# Patient Record
Sex: Male | Born: 1955
Health system: Southern US, Community
[De-identification: ages and names within clinical notes are randomized; demographics above are authoritative.]

## PROBLEM LIST (undated history)

## (undated) DIAGNOSIS — I10 Essential (primary) hypertension: Secondary | ICD-10-CM

## (undated) DIAGNOSIS — K5900 Constipation, unspecified: Secondary | ICD-10-CM

## (undated) DIAGNOSIS — C801 Malignant (primary) neoplasm, unspecified: Secondary | ICD-10-CM

## (undated) HISTORY — PX: PROSTATECTOMY: SHX69

## (undated) HISTORY — DX: Constipation, unspecified: K59.00

---

## 2015-08-17 DIAGNOSIS — I10 Essential (primary) hypertension: Secondary | ICD-10-CM | POA: Diagnosis not present

## 2015-08-17 DIAGNOSIS — E875 Hyperkalemia: Secondary | ICD-10-CM | POA: Diagnosis not present

## 2015-08-17 DIAGNOSIS — Z8546 Personal history of malignant neoplasm of prostate: Secondary | ICD-10-CM | POA: Diagnosis not present

## 2015-08-17 DIAGNOSIS — Z9221 Personal history of antineoplastic chemotherapy: Secondary | ICD-10-CM | POA: Diagnosis not present

## 2015-08-17 DIAGNOSIS — Z08 Encounter for follow-up examination after completed treatment for malignant neoplasm: Secondary | ICD-10-CM | POA: Diagnosis not present

## 2015-08-17 DIAGNOSIS — C61 Malignant neoplasm of prostate: Secondary | ICD-10-CM | POA: Diagnosis not present

## 2015-08-17 DIAGNOSIS — Z9079 Acquired absence of other genital organ(s): Secondary | ICD-10-CM | POA: Diagnosis not present

## 2016-02-22 DIAGNOSIS — Z8546 Personal history of malignant neoplasm of prostate: Secondary | ICD-10-CM | POA: Diagnosis not present

## 2016-02-22 DIAGNOSIS — C61 Malignant neoplasm of prostate: Secondary | ICD-10-CM | POA: Diagnosis not present

## 2016-02-22 DIAGNOSIS — Z08 Encounter for follow-up examination after completed treatment for malignant neoplasm: Secondary | ICD-10-CM | POA: Diagnosis not present

## 2016-02-22 DIAGNOSIS — I10 Essential (primary) hypertension: Secondary | ICD-10-CM | POA: Diagnosis not present

## 2016-02-22 DIAGNOSIS — Z9221 Personal history of antineoplastic chemotherapy: Secondary | ICD-10-CM | POA: Diagnosis not present

## 2016-02-22 DIAGNOSIS — Z9079 Acquired absence of other genital organ(s): Secondary | ICD-10-CM | POA: Diagnosis not present

## 2016-03-21 DIAGNOSIS — R030 Elevated blood-pressure reading, without diagnosis of hypertension: Secondary | ICD-10-CM | POA: Diagnosis not present

## 2016-06-09 DIAGNOSIS — R030 Elevated blood-pressure reading, without diagnosis of hypertension: Secondary | ICD-10-CM | POA: Diagnosis not present

## 2016-08-22 DIAGNOSIS — I10 Essential (primary) hypertension: Secondary | ICD-10-CM | POA: Diagnosis not present

## 2016-08-22 DIAGNOSIS — Z9079 Acquired absence of other genital organ(s): Secondary | ICD-10-CM | POA: Diagnosis not present

## 2016-08-22 DIAGNOSIS — C61 Malignant neoplasm of prostate: Secondary | ICD-10-CM | POA: Diagnosis not present

## 2016-08-22 DIAGNOSIS — Z9221 Personal history of antineoplastic chemotherapy: Secondary | ICD-10-CM | POA: Diagnosis not present

## 2017-02-23 DIAGNOSIS — C61 Malignant neoplasm of prostate: Secondary | ICD-10-CM | POA: Diagnosis not present

## 2017-02-23 DIAGNOSIS — Z9079 Acquired absence of other genital organ(s): Secondary | ICD-10-CM | POA: Diagnosis not present

## 2017-02-23 DIAGNOSIS — Z8546 Personal history of malignant neoplasm of prostate: Secondary | ICD-10-CM | POA: Diagnosis not present

## 2017-02-23 DIAGNOSIS — Z9221 Personal history of antineoplastic chemotherapy: Secondary | ICD-10-CM | POA: Diagnosis not present

## 2017-02-23 DIAGNOSIS — R739 Hyperglycemia, unspecified: Secondary | ICD-10-CM | POA: Diagnosis not present

## 2017-02-23 DIAGNOSIS — I10 Essential (primary) hypertension: Secondary | ICD-10-CM | POA: Diagnosis not present

## 2017-02-23 DIAGNOSIS — Z08 Encounter for follow-up examination after completed treatment for malignant neoplasm: Secondary | ICD-10-CM | POA: Diagnosis not present

## 2017-08-28 DIAGNOSIS — Z923 Personal history of irradiation: Secondary | ICD-10-CM | POA: Diagnosis not present

## 2017-08-28 DIAGNOSIS — Z08 Encounter for follow-up examination after completed treatment for malignant neoplasm: Secondary | ICD-10-CM | POA: Diagnosis not present

## 2017-08-28 DIAGNOSIS — Z8546 Personal history of malignant neoplasm of prostate: Secondary | ICD-10-CM | POA: Diagnosis not present

## 2017-08-28 DIAGNOSIS — Z9079 Acquired absence of other genital organ(s): Secondary | ICD-10-CM | POA: Diagnosis not present

## 2017-08-28 DIAGNOSIS — C61 Malignant neoplasm of prostate: Secondary | ICD-10-CM | POA: Diagnosis not present

## 2017-08-28 DIAGNOSIS — R739 Hyperglycemia, unspecified: Secondary | ICD-10-CM | POA: Diagnosis not present

## 2017-08-28 DIAGNOSIS — I10 Essential (primary) hypertension: Secondary | ICD-10-CM | POA: Diagnosis not present

## 2017-09-25 DIAGNOSIS — R030 Elevated blood-pressure reading, without diagnosis of hypertension: Secondary | ICD-10-CM | POA: Diagnosis not present

## 2017-09-25 DIAGNOSIS — Z6825 Body mass index (BMI) 25.0-25.9, adult: Secondary | ICD-10-CM | POA: Diagnosis not present

## 2018-03-05 DIAGNOSIS — C61 Malignant neoplasm of prostate: Secondary | ICD-10-CM | POA: Diagnosis not present

## 2018-03-05 DIAGNOSIS — Z192 Hormone resistant malignancy status: Secondary | ICD-10-CM | POA: Diagnosis not present

## 2018-03-05 DIAGNOSIS — R739 Hyperglycemia, unspecified: Secondary | ICD-10-CM | POA: Diagnosis not present

## 2018-03-05 DIAGNOSIS — I10 Essential (primary) hypertension: Secondary | ICD-10-CM | POA: Diagnosis not present

## 2018-05-07 DIAGNOSIS — R454 Irritability and anger: Secondary | ICD-10-CM | POA: Diagnosis not present

## 2018-06-11 DIAGNOSIS — Z6826 Body mass index (BMI) 26.0-26.9, adult: Secondary | ICD-10-CM | POA: Diagnosis not present

## 2018-06-11 DIAGNOSIS — I10 Essential (primary) hypertension: Secondary | ICD-10-CM | POA: Diagnosis not present

## 2018-06-11 DIAGNOSIS — R454 Irritability and anger: Secondary | ICD-10-CM | POA: Diagnosis not present

## 2018-07-30 DIAGNOSIS — R454 Irritability and anger: Secondary | ICD-10-CM | POA: Diagnosis not present

## 2018-07-30 DIAGNOSIS — I10 Essential (primary) hypertension: Secondary | ICD-10-CM | POA: Diagnosis not present

## 2018-09-03 DIAGNOSIS — C61 Malignant neoplasm of prostate: Secondary | ICD-10-CM | POA: Diagnosis not present

## 2018-09-03 DIAGNOSIS — Z192 Hormone resistant malignancy status: Secondary | ICD-10-CM | POA: Diagnosis not present

## 2019-03-04 DIAGNOSIS — I10 Essential (primary) hypertension: Secondary | ICD-10-CM | POA: Diagnosis not present

## 2019-03-04 DIAGNOSIS — Z9079 Acquired absence of other genital organ(s): Secondary | ICD-10-CM | POA: Diagnosis not present

## 2019-03-04 DIAGNOSIS — C61 Malignant neoplasm of prostate: Secondary | ICD-10-CM | POA: Diagnosis not present

## 2019-03-04 DIAGNOSIS — R739 Hyperglycemia, unspecified: Secondary | ICD-10-CM | POA: Diagnosis not present

## 2019-03-04 DIAGNOSIS — Z8546 Personal history of malignant neoplasm of prostate: Secondary | ICD-10-CM | POA: Diagnosis not present

## 2019-05-06 DIAGNOSIS — M25511 Pain in right shoulder: Secondary | ICD-10-CM | POA: Diagnosis not present

## 2019-05-06 DIAGNOSIS — Z8546 Personal history of malignant neoplasm of prostate: Secondary | ICD-10-CM | POA: Diagnosis not present

## 2019-09-02 DIAGNOSIS — Z192 Hormone resistant malignancy status: Secondary | ICD-10-CM | POA: Diagnosis not present

## 2019-09-02 DIAGNOSIS — C61 Malignant neoplasm of prostate: Secondary | ICD-10-CM | POA: Diagnosis not present

## 2019-09-02 DIAGNOSIS — I10 Essential (primary) hypertension: Secondary | ICD-10-CM | POA: Diagnosis not present

## 2019-09-12 ENCOUNTER — Encounter (HOSPITAL_COMMUNITY): Payer: Self-pay | Admitting: Emergency Medicine

## 2019-09-12 ENCOUNTER — Emergency Department (HOSPITAL_COMMUNITY): Payer: BC Managed Care – PPO

## 2019-09-12 ENCOUNTER — Emergency Department (HOSPITAL_COMMUNITY)
Admission: EM | Admit: 2019-09-12 | Discharge: 2019-09-12 | Disposition: A | Discharge status: Home / Self Care | Payer: BC Managed Care – PPO | Attending: Emergency Medicine | Admitting: Emergency Medicine

## 2019-09-12 ENCOUNTER — Other Ambulatory Visit: Payer: Self-pay

## 2019-09-12 DIAGNOSIS — Z79899 Other long term (current) drug therapy: Secondary | ICD-10-CM | POA: Insufficient documentation

## 2019-09-12 DIAGNOSIS — R55 Syncope and collapse: Secondary | ICD-10-CM

## 2019-09-12 DIAGNOSIS — K5900 Constipation, unspecified: Secondary | ICD-10-CM | POA: Diagnosis not present

## 2019-09-12 DIAGNOSIS — I10 Essential (primary) hypertension: Secondary | ICD-10-CM | POA: Insufficient documentation

## 2019-09-12 HISTORY — DX: Essential (primary) hypertension: I10

## 2019-09-12 HISTORY — DX: Malignant (primary) neoplasm, unspecified: C80.1

## 2019-09-12 LAB — COMPREHENSIVE METABOLIC PANEL
ALT: 15 U/L (ref 0–44)
AST: 21 U/L (ref 15–41)
Albumin: 4 g/dL (ref 3.5–5.0)
Alkaline Phosphatase: 52 U/L (ref 38–126)
Anion gap: 11 (ref 5–15)
BUN: 19 mg/dL (ref 8–23)
CO2: 25 mmol/L (ref 22–32)
Calcium: 8.7 mg/dL — ABNORMAL LOW (ref 8.9–10.3)
Chloride: 100 mmol/L (ref 98–111)
Creatinine, Ser: 1.05 mg/dL (ref 0.61–1.24)
GFR calc Af Amer: >60 mL/min (ref >60)
GFR calc non Af Amer: >60 mL/min (ref >60)
Glucose, Bld: 146 mg/dL — ABNORMAL HIGH (ref 70–99)
Potassium: 3.5 mmol/L (ref 3.5–5.1)
Sodium: 136 mmol/L (ref 135–145)
Total Bilirubin: 0.4 mg/dL (ref 0.3–1.2)
Total Protein: 7.4 g/dL (ref 6.5–8.1)

## 2019-09-12 LAB — CBC
HCT: 45.1 % (ref 39.0–52.0)
Hemoglobin: 15.1 g/dL (ref 13.0–17.0)
MCH: 30.9 pg (ref 26.0–34.0)
MCHC: 33.5 g/dL (ref 30.0–36.0)
MCV: 92.4 fL (ref 80.0–100.0)
Platelets: 177 10*3/uL (ref 150–400)
RBC: 4.88 MIL/uL (ref 4.22–5.81)
RDW: 12 % (ref 11.5–15.5)
WBC: 8.2 10*3/uL (ref 4.0–10.5)
nRBC: 0 % (ref 0.0–0.2)

## 2019-09-12 MED ORDER — SODIUM CHLORIDE 0.9 % IV BOLUS
1000.0000 mL | Freq: Once | INTRAVENOUS | Status: AC
  Administered 2019-09-12: 1000 mL via INTRAVENOUS

## 2019-09-12 MED ORDER — ONDANSETRON HCL 4 MG/2ML IJ SOLN
4.0000 mg | Freq: Once | INTRAMUSCULAR | Status: AC
  Administered 2019-09-12: 4 mg via INTRAVENOUS
  Filled 2019-09-12: qty 2

## 2019-09-12 NOTE — ED Notes (Signed)
Pt is aware we need urine sample, urinal at bedside.  

## 2019-09-12 NOTE — ED Triage Notes (Signed)
PT brought in by family today from home. PT now alert and oriented and vomiting with diaphoresis on arrival. PT states he when he woke up this morning he felt lightheaded and was walking from the bedroom to the living room and passed out and fell in the floor. PT states he has had constipation for the past few days and running a fever at home x2 days. PT denies any pain at this time.

## 2019-09-12 NOTE — ED Provider Notes (Signed)
[facility] EMERGENCY DEPARTMENT Provider Note   CSN: [id number] Arrival date & time: 09/12/19  [id number]     History Chief Complaint  Patient presents with  . Loss of Consciousness    Zachary Erickson is a 64 y.o. male.  HPI   This patient is a 64 year old male, reports a history of having hypertension, he does not take any other daily medications other than one blood pressure pill and he cannot remember the name of it.  He presents to the hospital after having a loss of consciousness this morning.  The patient reportedly has had some difficulty with bowel movements over the last month having 1 bowel movement every 3 days, feels very concerned about this and has developed a subjective fever for the last 2 days.  It has not been measured.  He denies any urinary symptoms, hematuria, dysuria, rashes, swelling, has no abdominal pain, no back pain, no chest pain coughing or shortness of breath.  He has been otherwise well and yesterday had normal food.  This morning he got up to use the bathroom and after he urinated upon walking back out of the bathroom he developed acute onset of lightheadedness, dizziness, had a syncopal event which was witnessed and collapsed to the ground.  He had episodes of nausea vomiting and diaphoresis and family brought him to the hospital immediately.  At the time of arrival the patient was pale and diaphoretic but gradually improving and now is back to baseline within 10 minutes of arrival.  He states that this time he just feels nauseated.  He denies any abdominal pain whatsoever at this time  Past Medical History:  Diagnosis Date  . Cancer [facility])    prostate cancer (2013)  . Hypertension     There are no problems to display for this patient.   History reviewed. No pertinent surgical history.     History reviewed. No pertinent family history.  Social History   Tobacco Use  . Smoking status: Never Smoker  . Smokeless tobacco: Never Used  Substance Use  Topics  . Alcohol use: Never  . Drug use: Never    Home Medications Prior to Admission medications   Medication Sig Start Date End Date Taking? Authorizing Provider  lisinopril (ZESTRIL) 10 MG tablet Take 10 mg by mouth daily. 08/12/19  Yes [provider]  Multiple Vitamin (MULTI-VITAMIN) tablet Take by mouth.   Yes [provider]    Allergies    Patient has no known allergies.  Review of Systems   Review of Systems  All other systems reviewed and are negative.   Physical Exam Updated Vital Signs BP 117/72   Pulse 68   Temp 98.8 F (37.1 C) (Oral)   Resp 15   Ht 1.727 m (5\' 8" )   Wt 74.4 kg   SpO2 96%   BMI 24.94 kg/m   Physical Exam Vitals and nursing note reviewed.  Constitutional:      General: He is not in acute distress.    Appearance: He is well-developed.  HENT:     Head: Normocephalic and atraumatic.     Mouth/Throat:     Pharynx: No oropharyngeal exudate.  Eyes:     General: No scleral icterus.       Right eye: No discharge.        Left eye: No discharge.     Conjunctiva/sclera: Conjunctivae normal.     Pupils: Pupils are equal, round, and reactive to light.  Neck:  Thyroid: No thyromegaly.     Vascular: No JVD.  Cardiovascular:     Rate and Rhythm: Normal rate and regular rhythm.     Heart sounds: Normal heart sounds. No murmur. No friction rub. No gallop.   Pulmonary:     Effort: Pulmonary effort is normal. No respiratory distress.     Breath sounds: Normal breath sounds. No wheezing or rales.  Abdominal:     General: Bowel sounds are normal. There is no distension.     Palpations: Abdomen is soft. There is no mass.     Tenderness: There is no abdominal tenderness.  Musculoskeletal:        General: No tenderness. Normal range of motion.     Cervical back: Normal range of motion and neck supple.  Lymphadenopathy:     Cervical: No cervical adenopathy.  Skin:    General: Skin is warm and dry.     Findings: No erythema  or rash.  Neurological:     General: No focal deficit present.     Mental Status: He is alert.     Coordination: Coordination normal.     Comments: Speech is clear, cranial nerves III through XII are intact, memory is intact, strength is normal in all 4 extremities including grips, sensation is intact to light touch and pinprick in all 4 extremities. Coordination as tested by finger-nose-finger is normal, no limb ataxia. Normal gait, normal reflexes at the patellar tendons bilaterally  Psychiatric:        Behavior: Behavior normal.     ED Results / Procedures / Treatments   Labs (all labs ordered are listed, but only abnormal results are displayed) Labs Reviewed  COMPREHENSIVE METABOLIC PANEL - Abnormal; Notable for the following components:      Result Value   Glucose, Bld 146 (*)    Calcium 8.7 (*)    All other components within normal limits  CBC  URINALYSIS, ROUTINE W REFLEX MICROSCOPIC    EKG EKG Interpretation  Date/Time:  Monday Sep 12 2019 08:44:05 EDT Ventricular Rate:  63 PR Interval:    QRS Duration: 98 QT Interval:  400 QTC Calculation: 410 R Axis:   29 Text Interpretation: Sinus rhythm No old tracing to compare Confirmed by Noemi Chapel [phone]) on 09/12/2019 8:54:32 AM   Radiology DG ABD ACUTE 2+V W 1V CHEST  Result Date: 09/12/2019 CLINICAL DATA:  Syncope.  Constipation.  Fever.  Nausea. EXAM: DG ABDOMEN ACUTE W/ 1V CHEST COMPARISON:  None. FINDINGS: No pneumothorax. The heart, hila, and mediastinum are normal. No pulmonary nodules or masses. No focal infiltrates. No free air, portal venous gas, or pneumatosis. There is a paucity of bowel gas but no evidence of obstruction. Moderate fecal loading in the colon. No renal or ureteral stones identified. A lucent centered calcification in the right pelvis is consistent with a phlebolith. IMPRESSION: 1. Moderate fecal loading in the colon. 2. No other abnormalities. Electronically Signed   By: Dorise Bullion III M.D    On: 09/12/2019 09:25    Procedures Procedures (including critical care time)  Medications Ordered in ED Medications  ondansetron [facility]) injection 4 mg (4 mg Intravenous Given 09/12/19 0909)  sodium chloride 0.9 % bolus 1,000 mL (0 mLs Intravenous Stopped 09/12/19 1123)    ED Course  I have reviewed the triage vital signs and the nursing notes.  Pertinent labs & imaging results that were available during my care of the patient were reviewed by me and considered in my medical decision  making (see chart for details).    MDM Rules/Calculators/A&P                      This patient's EKG is totally normal, his mental status is awake and alert with normal vital signs.  He will need orthostatics, will check a hemoglobin, metabolic panel, he is afebrile here with a temperature of 99.3.  He is not having any respiratory symptoms and has clear lungs.  I will get a urinalysis, the patient otherwise appears well, will get some IV fluids and Zofran as well.  Patient agreeable to the plan  No orthostatic changes, lab work unremarkable, patient stable awake alert and has had no recurrent symptoms.  X-ray does show some constipation, encouraged to use MiraLAX, family member at the bedside, they have expressed understanding to the indications for return follow-up.  Stable for discharge  Final Clinical Impression(s) / ED Diagnoses Final diagnoses:  Vasovagal syncope      Noemi Chapel, MD 09/12/19 1128

## 2019-09-12 NOTE — Discharge Instructions (Addendum)
Please read the attached instructions Please drink plenty of liquids today, get plenty of rest, seek medical exam for worsening symptoms.  Please take MiraLAX 1 scoop a day to help regulate your bowels and follow-up with your doctor regarding constipation.

## 2019-09-13 DIAGNOSIS — R55 Syncope and collapse: Secondary | ICD-10-CM | POA: Diagnosis not present

## 2019-09-19 ENCOUNTER — Emergency Department (HOSPITAL_COMMUNITY): Payer: BC Managed Care – PPO

## 2019-09-19 ENCOUNTER — Encounter (HOSPITAL_COMMUNITY): Payer: Self-pay

## 2019-09-19 ENCOUNTER — Emergency Department (HOSPITAL_COMMUNITY)
Admission: EM | Admit: 2019-09-19 | Discharge: 2019-09-19 | Disposition: A | Discharge status: Home / Self Care | Payer: BC Managed Care – PPO | Attending: Emergency Medicine | Admitting: Emergency Medicine

## 2019-09-19 ENCOUNTER — Other Ambulatory Visit: Payer: Self-pay

## 2019-09-19 DIAGNOSIS — K5900 Constipation, unspecified: Secondary | ICD-10-CM | POA: Diagnosis not present

## 2019-09-19 DIAGNOSIS — Z79899 Other long term (current) drug therapy: Secondary | ICD-10-CM | POA: Insufficient documentation

## 2019-09-19 DIAGNOSIS — I10 Essential (primary) hypertension: Secondary | ICD-10-CM | POA: Diagnosis not present

## 2019-09-19 DIAGNOSIS — R1084 Generalized abdominal pain: Secondary | ICD-10-CM | POA: Diagnosis not present

## 2019-09-19 DIAGNOSIS — Z8546 Personal history of malignant neoplasm of prostate: Secondary | ICD-10-CM | POA: Insufficient documentation

## 2019-09-19 LAB — I-STAT CREATININE, ED: Creatinine, Ser: 0.9 mg/dL (ref 0.61–1.24)

## 2019-09-19 MED ORDER — IOHEXOL 300 MG/ML  SOLN
100.0000 mL | Freq: Once | INTRAMUSCULAR | Status: AC | PRN
  Administered 2019-09-19: 100 mL via INTRAVENOUS

## 2019-09-19 NOTE — ED Provider Notes (Signed)
[facility] EMERGENCY DEPARTMENT Provider Note   CSN: [id number] Arrival date & time: 09/19/19  0915     History Chief Complaint  Patient presents with   Constipation    Zachary Erickson is a 64 y.o. male.  The history is provided by the patient. No language interpreter was used.  Constipation Severity:  Moderate Timing:  Constant Progression:  Worsening Chronicity:  New Context: not narcotics   Stool description:  Unable to specify Relieved by:  Nothing Worsened by:  Nothing Ineffective treatments:  None tried Associated symptoms: no abdominal pain   Risk factors: no recent antibiotic use   Pt reports he has had frequent constipation.  Pt reports he is concerned that he has cancer.  Pt denies fever or chills.  Pt has a histroy of prostate cancer.  He is followed by oncology at [facility].       Past Medical History:  Diagnosis Date   Cancer [facility])    prostate cancer (2013)   Hypertension     There are no problems to display for this patient.   History reviewed. No pertinent surgical history.     No family history on file.  Social History   Tobacco Use   Smoking status: Never Smoker   Smokeless tobacco: Never Used  Substance Use Topics   Alcohol use: Never   Drug use: Never    Home Medications Prior to Admission medications   Medication Sig Start Date End Date Taking? Authorizing Provider  lisinopril (ZESTRIL) 10 MG tablet Take 10 mg by mouth daily. 08/12/19   [provider]  Multiple Vitamin (MULTI-VITAMIN) tablet Take by mouth.    [provider]    Allergies    Patient has no known allergies.  Review of Systems   Review of Systems  Gastrointestinal: Positive for constipation. Negative for abdominal pain.  All other systems reviewed and are negative.   Physical Exam Updated Vital Signs BP 131/74 (BP Location: Left Arm)    Pulse 89    Temp 98.7 F (37.1 C) (Oral)    Resp 15    Ht 5\' 8"  (1.727 m)    Wt 74 kg    SpO2  96%    BMI 24.81 kg/m   Physical Exam Vitals and nursing note reviewed.  Constitutional:      Appearance: He is well-developed.  HENT:     Head: Normocephalic and atraumatic.     Nose: Nose normal.     Mouth/Throat:     Mouth: Mucous membranes are moist.  Eyes:     Conjunctiva/sclera: Conjunctivae normal.  Cardiovascular:     Rate and Rhythm: Normal rate and regular rhythm.     Heart sounds: No murmur.  Pulmonary:     Effort: Pulmonary effort is normal. No respiratory distress.     Breath sounds: Normal breath sounds.  Abdominal:     Palpations: Abdomen is soft.     Tenderness: There is abdominal tenderness.  Musculoskeletal:        General: Normal range of motion.     Cervical back: Neck supple.  Skin:    General: Skin is warm and dry.  Neurological:     General: No focal deficit present.     Mental Status: He is alert and oriented to person, place, and time.  Psychiatric:        Mood and Affect: Mood normal.     ED Results / Procedures / Treatments   Labs (all labs ordered are listed,  but only abnormal results are displayed) Labs Reviewed  I-STAT CREATININE, ED    EKG None  Radiology CT ABDOMEN PELVIS W CONTRAST  Result Date: 09/19/2019 CLINICAL DATA:  Constipation since last Tuesday, abdominal pain EXAM: CT ABDOMEN AND PELVIS WITH CONTRAST TECHNIQUE: Multidetector CT imaging of the abdomen and pelvis was performed using the standard protocol following bolus administration of intravenous contrast. CONTRAST:  125mL OMNIPAQUE IOHEXOL 300 MG/ML  SOLN COMPARISON:  09/12/2019 FINDINGS: Lower chest: Hypoventilatory changes are seen at the lung bases. Hepatobiliary: No focal liver abnormality is seen. No gallstones, gallbladder wall thickening, or biliary dilatation. Pancreas: Unremarkable. No pancreatic ductal dilatation or surrounding inflammatory changes. Spleen: Normal in size without focal abnormality. Adrenals/Urinary Tract: Adrenal glands are unremarkable. Kidneys  are normal, without renal calculi, focal lesion, or hydronephrosis. Bladder is unremarkable. Stomach/Bowel: No bowel obstruction or ileus. Normal appendix right lower quadrant. No bowel wall thickening or inflammatory change. Vascular/Lymphatic: Aortic atherosclerosis. No enlarged abdominal or pelvic lymph nodes. Reproductive: Prostate is unremarkable. Other: No abdominal wall hernia or abnormality. No abdominopelvic ascites. Musculoskeletal: No acute or destructive bony lesions. Reconstructed images demonstrate no additional findings. IMPRESSION: 1. No acute intra-abdominal or intrapelvic process. 2.  Aortic Atherosclerosis (ICD10-I70.0). Electronically Signed   By: Randa Ngo M.D.   On: 09/19/2019 15:45    Procedures Procedures (including critical care time)  Medications Ordered in ED Medications  iohexol (OMNIPAQUE) 300 MG/ML solution 100 mL (100 mLs Intravenous Contrast Given 09/19/19 1510)    ED Course  I have reviewed the triage vital signs and the nursing notes.  Pertinent labs & imaging results that were available during my care of the patient were reviewed by me and considered in my medical decision making (see chart for details).    MDM Rules/Calculators/A&P                      MDM: Pt seen here on 5/31, Pt had normal cbc and Cmet.  Ct scan obtained.  No acute abnormality.  Pt advised to schedule to follow up with Dr. Gala Romney for evaluation  Final Clinical Impression(s) / ED Diagnoses Final diagnoses:  Generalized abdominal pain    Rx / DC Orders ED Discharge Orders    None    An After Visit Summary was printed and given to the patient.    Fransico Meadow, [place] 09/19/19 1617    Fredia Sorrow, MD 10/03/19 2330

## 2019-09-19 NOTE — ED Triage Notes (Signed)
Pt reports constipation since Tuesday with some abdominal pain. Pt has taken miralax without relief. Pt reports he just feesl so bad

## 2019-09-19 NOTE — Discharge Instructions (Addendum)
Continue miralax.  Drink one bottle of mag citrate and drink a second bottle in 12 hours.  Schedule to see the [facility] doctor for evaluation.  See your Physician for recheck if any problems.

## 2019-09-20 ENCOUNTER — Telehealth: Payer: Self-pay | Admitting: Internal Medicine

## 2019-09-20 ENCOUNTER — Encounter: Payer: Self-pay | Admitting: Internal Medicine

## 2019-09-20 NOTE — Telephone Encounter (Signed)
ER REFERRAL  

## 2019-09-20 NOTE — Telephone Encounter (Signed)
SCHEDULED PATIENT AND SENT LETTER  °

## 2019-09-20 NOTE — Telephone Encounter (Signed)
Ok to schedule ov.  

## 2019-09-28 ENCOUNTER — Encounter: Payer: Self-pay | Admitting: Gastroenterology

## 2019-09-28 ENCOUNTER — Other Ambulatory Visit: Payer: Self-pay

## 2019-09-28 ENCOUNTER — Ambulatory Visit: Payer: BC Managed Care – PPO | Admitting: Gastroenterology

## 2019-09-28 DIAGNOSIS — K5900 Constipation, unspecified: Secondary | ICD-10-CM

## 2019-09-28 DIAGNOSIS — Z1211 Encounter for screening for malignant neoplasm of colon: Secondary | ICD-10-CM | POA: Diagnosis not present

## 2019-09-28 NOTE — Progress Notes (Addendum)
Primary Care Physician:  Asencion Noble, MD  Referring Physician: Dr. Willey Blade  Primary Gastroenterologist:  Dr. Gala Romney   Chief Complaint  Patient presents with   Constipation    Miralax stopped working, last bm Sunday with Mag Citrate, took yesterday but didn't work, stomach rumbling, prostate cancer 8 yrs ago    HPI:   Zachary Erickson is a 64 y.o. male presenting today at the request of Dr. Willey Blade due to constipation and abdominal pain. CT June 2021 in ED without acute process. No anemia on CBC 09/12/19. LFTs normal.   About 4 months ago noted constipation, progressing now. Took an herbal product about 5-6 days ago, had normal BM. Waited a few days then took again but it wasn't as productive. No rectal bleeding. Had mild pain in stomach when presented to ED but none now.   Last BM Sunday after Magnesium Citrate. Took Mag Citrate on Tuesday as well but hasn't had a BM (today is Wednesday). Was taking Miralax for 3-4 days, which seemed to help but then stopped. Called Dr. Willey Blade and said he was told to increase the dose. Went back to the ED. First visit in ED had an xray then second time had a CT scan. No pencil thin stools. Has seen [place] stool scale #1.   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 Was having only 2-3 hours of sleep a day.   Hasn't taken lisinopril in past few weeks. Baseline bowel habits were every other day. Has never been an every day person. Quit eating bread awhile ago as he didn't want to gain weight. His baseline has been around 164-166, now 156. Appetite is ok right now. Active with walking with his wife but hasn't in the past few weeks.   No prior colonoscopy. No family history of colorectal cancer or colon polyps. Father lived to be 45. He is very anxious regarding a blockage or cancer.   Past Medical History:  Diagnosis Date   Cancer [facility])    prostate cancer (2013)   Hypertension     Past Surgical History:  Procedure Laterality Date     PROSTATECTOMY      Current Outpatient Medications  Medication Sig Dispense Refill   Multiple Vitamin (MULTI-VITAMIN) tablet Take by mouth.     linaclotide (LINZESS) 145 MCG CAPS capsule Take 1 capsule (145 mcg total) by mouth daily before breakfast. 30 capsule 5   No current facility-administered medications for this visit.    Allergies as of 09/28/2019   (No Known Allergies)    Family History  Problem Relation Age of Onset   Colon cancer Neg Hx    Colon polyps Neg Hx     Social History   Socioeconomic History   Marital status: Married    Spouse name: Not on file   Number of children: Not on file   Years of education: Not on file   Highest education level: Not on file  Occupational History   Occupation: truck driver  Tobacco Use   Smoking status: Never Smoker   Smokeless tobacco: Never Used  Scientific laboratory technician Use: Never used  Substance and Sexual Activity   Alcohol use: Never   Drug use: Never   Sexual activity: Not on file  Other Topics Concern   Not on file  Social History Narrative   Not on file   Social Determinants of Health   Financial Resource Strain:    Difficulty of Paying Living  Expenses:   Food Insecurity:    Worried About Charity fundraiser in the Last Year:    Arboriculturist in the Last Year:   Transportation Needs:    Film/video editor (Medical):    Lack of Transportation (Non-Medical):   Physical Activity:    Days of Exercise per Week:    Minutes of Exercise per Session:   Stress:    Feeling of Stress :   Social Connections:    Frequency of Communication with Friends and Family:    Frequency of Social Gatherings with Friends and Family:    Attends Religious Services:    Active Member of Clubs or Organizations:    Attends Music therapist:    Marital Status:   Intimate Partner Violence:    Fear of Current or Ex-Partner:    Emotionally Abused:    Physically Abused:     Sexually Abused:     Review of Systems: Gen: Denies any fever, chills, fatigue, weight loss, lack of appetite.  CV: Denies chest pain, heart palpitations, peripheral edema, syncope.  Resp: Denies shortness of breath at rest or with exertion. Denies wheezing or cough.  GI: see HPI GU : Denies urinary burning, urinary frequency, urinary hesitancy MS: Denies joint pain, muscle weakness, cramps, or limitation of movement.  Derm: Denies rash, itching, dry skin Psych: Denies depression, anxiety, memory loss, and confusion Heme: Denies bruising, bleeding, and enlarged lymph nodes.  Physical Exam: BP (!) 148/89    Pulse (!) 103    Temp 97.7 F (36.5 C) (Temporal)    Ht 5\' 8"  (1.727 m)    Wt 156 lb 6.4 oz (70.9 kg)    BMI 23.78 kg/m  General:   Alert and oriented. Pleasant and cooperative. Well-nourished and well-developed.  Head:  Normocephalic and atraumatic. Eyes:  Without icterus, sclera clear and conjunctiva pink.  Ears:  Normal auditory acuity. Mouth:  Mask in place  Lungs:  Clear to auscultation bilaterally.  Heart:  S1, S2 present without murmurs appreciated.  Abdomen:  +BS, soft, non-tender and non-distended. No HSM noted. No guarding or rebound. No masses appreciated.  Rectal:  DRE without mass Msk:  Symmetrical without gross deformities. Normal posture. Extremities:  Without edema. Neurologic:  Alert and  oriented x4;  grossly normal neurologically. Skin:  Intact without significant lesions or rashes. Psych:  Alert and cooperative. Normal mood and affect.  ASSESSMENT: Zachary Erickson is a 64 y.o. male presenting today with worsening constipation, no rectal bleeding, and self-reported weight loss of about 10 lbs in the past several months. He has changed his diet, eliminating breads, which could be contributing to weight loss but etiology unclear. CT in ED negative for concerning findings, no anemia on CBC, LFTs normal. Rectal exam without mass. He is anxious about an obstruction  or occult malignancy.   No prior colonoscopy, and he denies any family history of colorectal cancer or polyps. We will maximize bowel regimen at this point and pursue initial screening colonoscopy in the near future. He was reassured today after review of labs, imaging, and physical exam. He is to call if any further weight loss.   I provided patient Linzess 290 mcg at time of visit. He has since called stating this was too strong. We have decreased to 145 mcg daily.   PLAN:  Maximize bowel regimen: Linzess 290 mcg initially too strong and now backed down to 145 mcg daily.  Proceed with TCS with Dr. Gala Romney in near  future: the risks, benefits, and alternatives have been discussed with the patient in detail. The patient states understanding and desires to proceed.  Call if any further weight changes.   Further recommendations to follow   Annitta Needs, PhD, ANP-BC [facility]astroenter[facility]

## 2019-09-28 NOTE — Patient Instructions (Addendum)
I have given you Linzess to take for constipation. Take this 30 minutes before breakfast daily (if taken with food, could cause diarrhea).   Let me know how you are doing with this next week!  We are arranging a colonoscopy in the near future.  It was a pleasure to see you today. I want to create trusting relationships with patients to provide genuine, compassionate, and quality care. I value your feedback. If you receive a survey regarding your visit,  I greatly appreciate you taking time to fill this out.   Annitta Needs, PhD, ANP-BC [facility]enter[facility]    Constipation, Adult Constipation is when a person has fewer bowel movements in a week than normal, has difficulty having a bowel movement, or has stools that are dry, hard, or larger than normal. Constipation may be caused by an underlying condition. It may become worse with age if a person takes certain medicines and does not take in enough fluids. Follow these instructions at home: Eating and drinking   Eat foods that have a lot of fiber, such as fresh fruits and vegetables, whole grains, and beans.  Limit foods that are high in fat, low in fiber, or overly processed, such as french fries, hamburgers, cookies, candies, and soda.  Drink enough fluid to keep your urine clear or pale yellow. General instructions  Exercise regularly or as told by your health care provider.  Go to the restroom when you have the urge to go. Do not hold it in.  Take over-the-counter and prescription medicines only as told by your health care provider. These include any fiber supplements.  Practice pelvic floor retraining exercises, such as deep breathing while relaxing the lower abdomen and pelvic floor relaxation during bowel movements.  Watch your condition for any changes.  Keep all follow-up visits as told by your health care provider. This is important. Contact a health care provider if:  You have pain that gets worse.  You have a  fever.  You do not have a bowel movement after 4 days.  You vomit.  You are not hungry.  You lose weight.  You are bleeding from the anus.  You have thin, pencil-like stools. Get help right away if:  You have a fever and your symptoms suddenly get worse.  You leak stool or have blood in your stool.  Your abdomen is bloated.  You have severe pain in your abdomen.  You feel dizzy or you faint. This information is not intended to replace advice given to you by your health care provider. Make sure you discuss any questions you have with your health care provider. Document Revised: 03/13/2017 Document Reviewed: 09/19/2015 Elsevier Patient Education  2020 [employer].

## 2019-09-29 ENCOUNTER — Telehealth: Payer: Self-pay

## 2019-09-29 NOTE — Telephone Encounter (Signed)
Pt called- He said the pills he was given yesterday are too strong but he really wants to speak with you personally. He said he is going out of town and will be leaving tomorrow afternoon and would appreciate a call back. He said it wont take but a few minutes and he refused to give me any further info. He is aware that it will be tomorrow before he gets a call back. [phone] (home number) or [phone] (cell number)

## 2019-09-30 ENCOUNTER — Telehealth: Payer: Self-pay | Admitting: Internal Medicine

## 2019-09-30 MED ORDER — LINACLOTIDE 145 MCG PO CAPS: 145.0000 ug | ORAL_CAPSULE | Freq: Every day | ORAL | 5 refills | Status: DC

## 2019-09-30 NOTE — Telephone Encounter (Signed)
I tried patient on home phone and had to leave message. I also tried cell, but no message leaving capabilities were available.

## 2019-09-30 NOTE — Telephone Encounter (Signed)
Pt wanted LSL to know that the medication he was trying was causing diarrhea and could she cut back the dosage. [facility] uses [facility]. [phone] or [phone]

## 2019-09-30 NOTE — Telephone Encounter (Signed)
Spoke with patient. Linzess 290 mcg too strong. I sent in Linzess 145 mcg daily.

## 2019-10-03 ENCOUNTER — Encounter: Payer: Self-pay | Admitting: Gastroenterology

## 2019-10-03 NOTE — Telephone Encounter (Signed)
Pt notified that samples are ready for pickup.

## 2019-10-03 NOTE — Telephone Encounter (Signed)
Spoke with pt. He received the Linzess 145 mcg that was sent in by AB. AB pt states that Linzess 145 mcg was taken this morning and is too strong for pt. Pt says his stool is loose.   FYI pt also wants to mention that when he is scheduled for a TCS, he is requesting a Friday due to working out of town the other days.

## 2019-10-03 NOTE — Progress Notes (Signed)
Cc'ed to pcp °

## 2019-10-03 NOTE — Telephone Encounter (Signed)
Ok. He can come by and pick up the Linzess 72 mcg samples.

## 2019-10-04 ENCOUNTER — Telehealth: Payer: Self-pay | Admitting: Internal Medicine

## 2019-10-04 NOTE — Telephone Encounter (Signed)
Patient called asking if he could be taken out of work until the week after he starts the new dosage of meds that he is picking up today, wants to make sure they work

## 2019-10-04 NOTE — Telephone Encounter (Signed)
AB, see previous notes from yesterday 10/03/19 if needed. Pt is stopping by the office today to pick up samples of the lower dose of Linzess. Pt is asking to be taken out of work due to the diarrhea he has with this medication. Please advise.

## 2019-10-04 NOTE — Telephone Encounter (Signed)
Spoke with pt and the way his work schedule is set up, pt is asking to go back to work on July 4th. His off days are Friday & Saturday. 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 Pt may not be able to get to the bathroom easily. Please advise.

## 2019-10-04 NOTE — Telephone Encounter (Signed)
At this point, I am concerned his work will not cover him to be out. If he had time to take (such as a few days), then that would be ok. However, July 4th will be too long and not able to be medically justified. As he has been having good bowel movements (too productive actually), I recommend holding off on trying the lowest dosage of Linzess till the weekend when he can try at home.

## 2019-10-04 NOTE — Telephone Encounter (Signed)
Noted. Spoke with pt and he will take a note for Monday. Pt will stop by to pick it up.

## 2019-10-04 NOTE — Telephone Encounter (Signed)
He can be out this week while we are figuring out the best dosage. 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 Return to work Monday.

## 2019-10-14 ENCOUNTER — Telehealth: Payer: Self-pay | Admitting: Gastroenterology

## 2019-10-14 NOTE — Telephone Encounter (Signed)
Great!

## 2019-10-14 NOTE — Telephone Encounter (Signed)
Patient called and said that he has not needed to take the samples he was given here, he has been going to the bathroom without issue

## 2019-10-24 ENCOUNTER — Telehealth: Payer: Self-pay | Admitting: *Deleted

## 2019-10-24 NOTE — Telephone Encounter (Signed)
Left message with wife for pt to call us back.  Will need to schedule procedure.

## 2019-10-25 ENCOUNTER — Telehealth: Payer: Self-pay | Admitting: *Deleted

## 2019-10-25 NOTE — Telephone Encounter (Signed)
Called pt and tried to schedule his procedure.  Pt requested a Friday procedure and I offered him different dates in Aug with Dr. Abbey Chatters.  Pt requested to call me back today or tomorrow.  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

## 2019-11-07 ENCOUNTER — Ambulatory Visit: Payer: BC Managed Care – PPO | Admitting: Gastroenterology

## 2019-12-14 ENCOUNTER — Encounter: Payer: Self-pay | Admitting: *Deleted

## 2019-12-14 NOTE — Telephone Encounter (Signed)
No response from pt yet.  Mailed letter as a reminder.

## 2020-02-24 DIAGNOSIS — I700 Atherosclerosis of aorta: Secondary | ICD-10-CM | POA: Diagnosis not present

## 2020-02-24 DIAGNOSIS — I10 Essential (primary) hypertension: Secondary | ICD-10-CM | POA: Diagnosis not present

## 2020-05-25 DIAGNOSIS — N529 Male erectile dysfunction, unspecified: Secondary | ICD-10-CM | POA: Diagnosis not present

## 2020-05-25 DIAGNOSIS — Z79899 Other long term (current) drug therapy: Secondary | ICD-10-CM | POA: Diagnosis not present

## 2020-05-25 DIAGNOSIS — C61 Malignant neoplasm of prostate: Secondary | ICD-10-CM | POA: Diagnosis not present

## 2020-05-25 DIAGNOSIS — Z125 Encounter for screening for malignant neoplasm of prostate: Secondary | ICD-10-CM | POA: Diagnosis not present

## 2020-05-25 DIAGNOSIS — I700 Atherosclerosis of aorta: Secondary | ICD-10-CM | POA: Diagnosis not present

## 2020-05-25 DIAGNOSIS — I10 Essential (primary) hypertension: Secondary | ICD-10-CM | POA: Diagnosis not present

## 2020-06-01 DIAGNOSIS — I700 Atherosclerosis of aorta: Secondary | ICD-10-CM | POA: Diagnosis not present

## 2020-06-01 DIAGNOSIS — I10 Essential (primary) hypertension: Secondary | ICD-10-CM | POA: Diagnosis not present

## 2020-11-22 ENCOUNTER — Encounter: Payer: Self-pay | Admitting: Internal Medicine

## 2020-12-07 DIAGNOSIS — I700 Atherosclerosis of aorta: Secondary | ICD-10-CM | POA: Diagnosis not present

## 2020-12-07 DIAGNOSIS — I10 Essential (primary) hypertension: Secondary | ICD-10-CM | POA: Diagnosis not present

## 2021-01-11 ENCOUNTER — Ambulatory Visit: Payer: BC Managed Care – PPO | Admitting: Gastroenterology

## 2021-03-29 ENCOUNTER — Ambulatory Visit: Payer: BC Managed Care – PPO | Admitting: Gastroenterology

## 2021-05-31 DIAGNOSIS — E785 Hyperlipidemia, unspecified: Secondary | ICD-10-CM | POA: Diagnosis not present

## 2021-05-31 DIAGNOSIS — Z79899 Other long term (current) drug therapy: Secondary | ICD-10-CM | POA: Diagnosis not present

## 2021-05-31 DIAGNOSIS — I700 Atherosclerosis of aorta: Secondary | ICD-10-CM | POA: Diagnosis not present

## 2021-05-31 DIAGNOSIS — Z8500 Personal history of malignant neoplasm of unspecified digestive organ: Secondary | ICD-10-CM | POA: Diagnosis not present

## 2021-05-31 DIAGNOSIS — I10 Essential (primary) hypertension: Secondary | ICD-10-CM | POA: Diagnosis not present

## 2021-06-07 DIAGNOSIS — E785 Hyperlipidemia, unspecified: Secondary | ICD-10-CM | POA: Diagnosis not present

## 2021-06-07 DIAGNOSIS — Z8500 Personal history of malignant neoplasm of unspecified digestive organ: Secondary | ICD-10-CM | POA: Diagnosis not present

## 2021-06-07 DIAGNOSIS — I700 Atherosclerosis of aorta: Secondary | ICD-10-CM | POA: Diagnosis not present

## 2021-06-07 DIAGNOSIS — I10 Essential (primary) hypertension: Secondary | ICD-10-CM | POA: Diagnosis not present

## 2021-08-30 DIAGNOSIS — E785 Hyperlipidemia, unspecified: Secondary | ICD-10-CM | POA: Diagnosis not present

## 2021-08-30 DIAGNOSIS — I700 Atherosclerosis of aorta: Secondary | ICD-10-CM | POA: Diagnosis not present

## 2021-08-30 DIAGNOSIS — I10 Essential (primary) hypertension: Secondary | ICD-10-CM | POA: Diagnosis not present

## 2021-08-30 DIAGNOSIS — Z79899 Other long term (current) drug therapy: Secondary | ICD-10-CM | POA: Diagnosis not present

## 2021-09-06 DIAGNOSIS — E785 Hyperlipidemia, unspecified: Secondary | ICD-10-CM | POA: Diagnosis not present

## 2021-09-06 DIAGNOSIS — I700 Atherosclerosis of aorta: Secondary | ICD-10-CM | POA: Diagnosis not present

## 2021-10-17 IMAGING — DX DG ABDOMEN ACUTE W/ 1V CHEST
3 series · 3 of 3 positions shown · non-contrast
Comparison: None.

CLINICAL DATA: Syncope.  Constipation.  Fever.  Nausea.

EXAM:
DG ABDOMEN ACUTE W/ 1V CHEST

[chest pa]
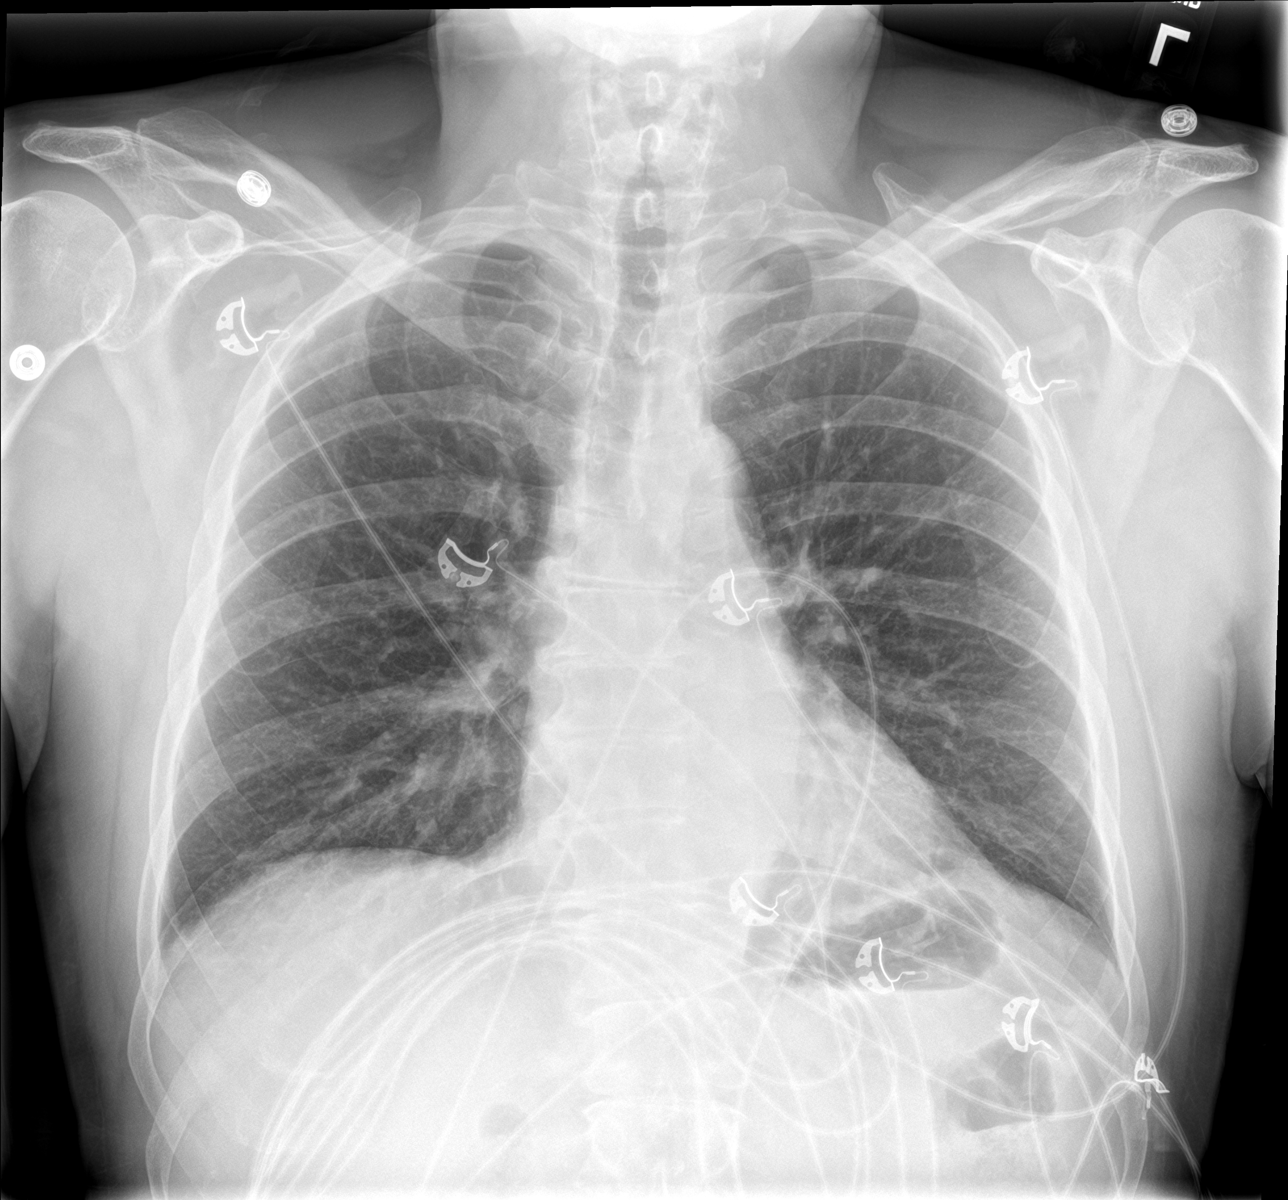

[abdomen erect]
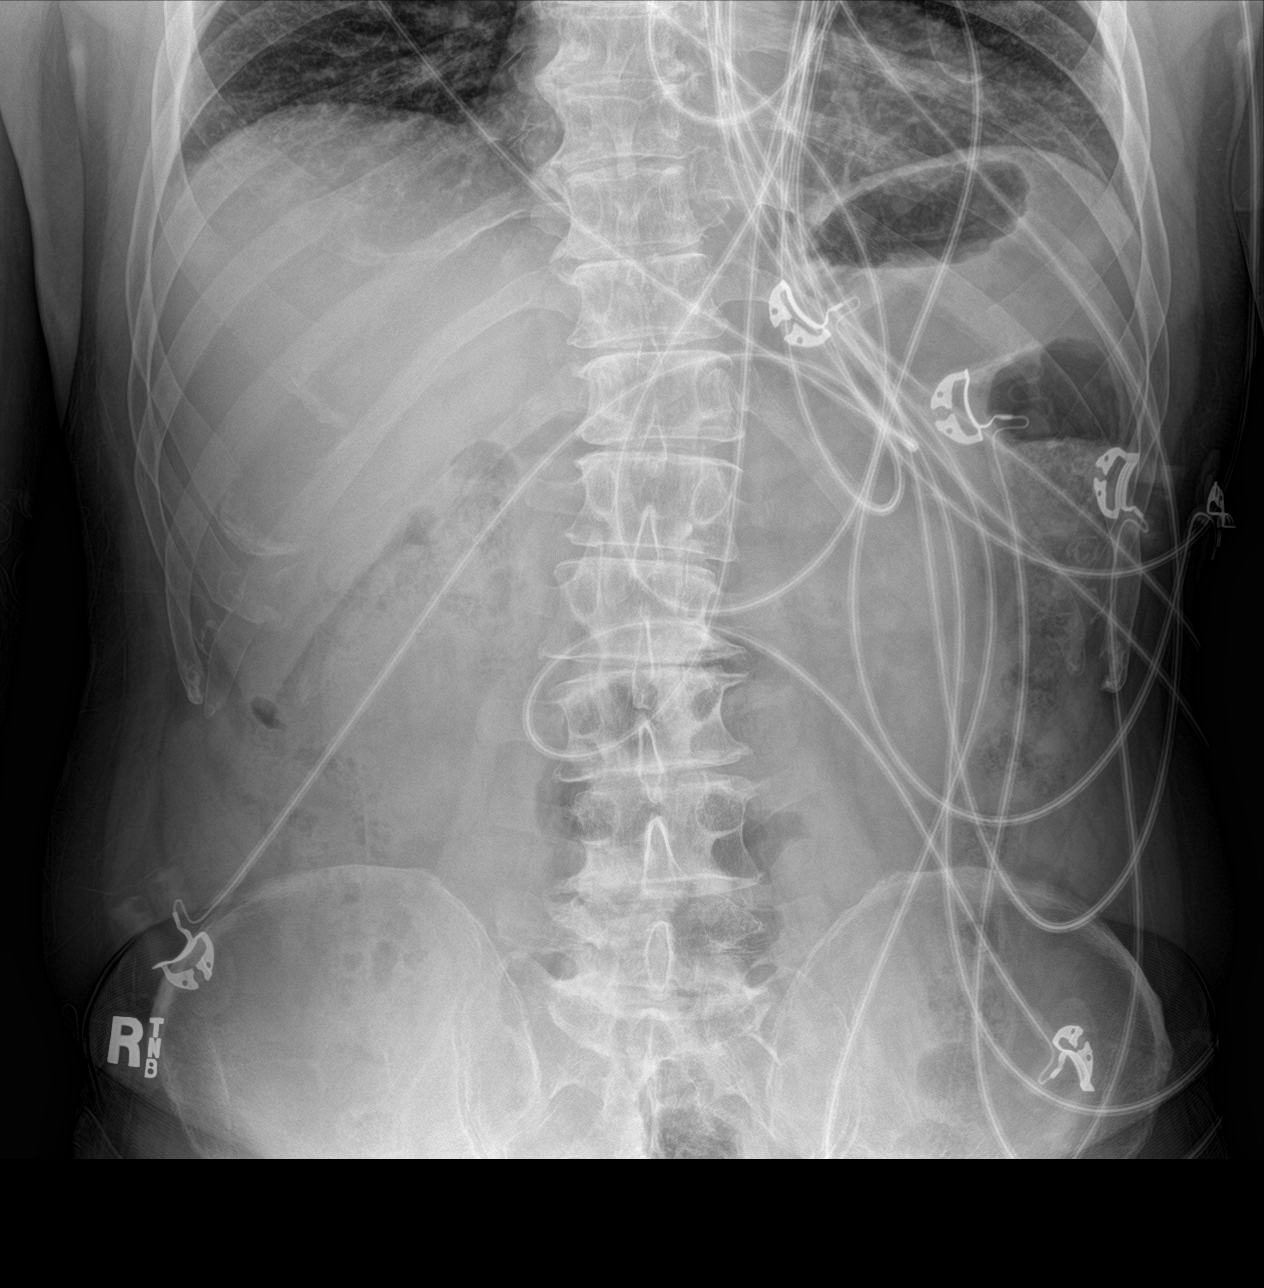

[abdomen supine]
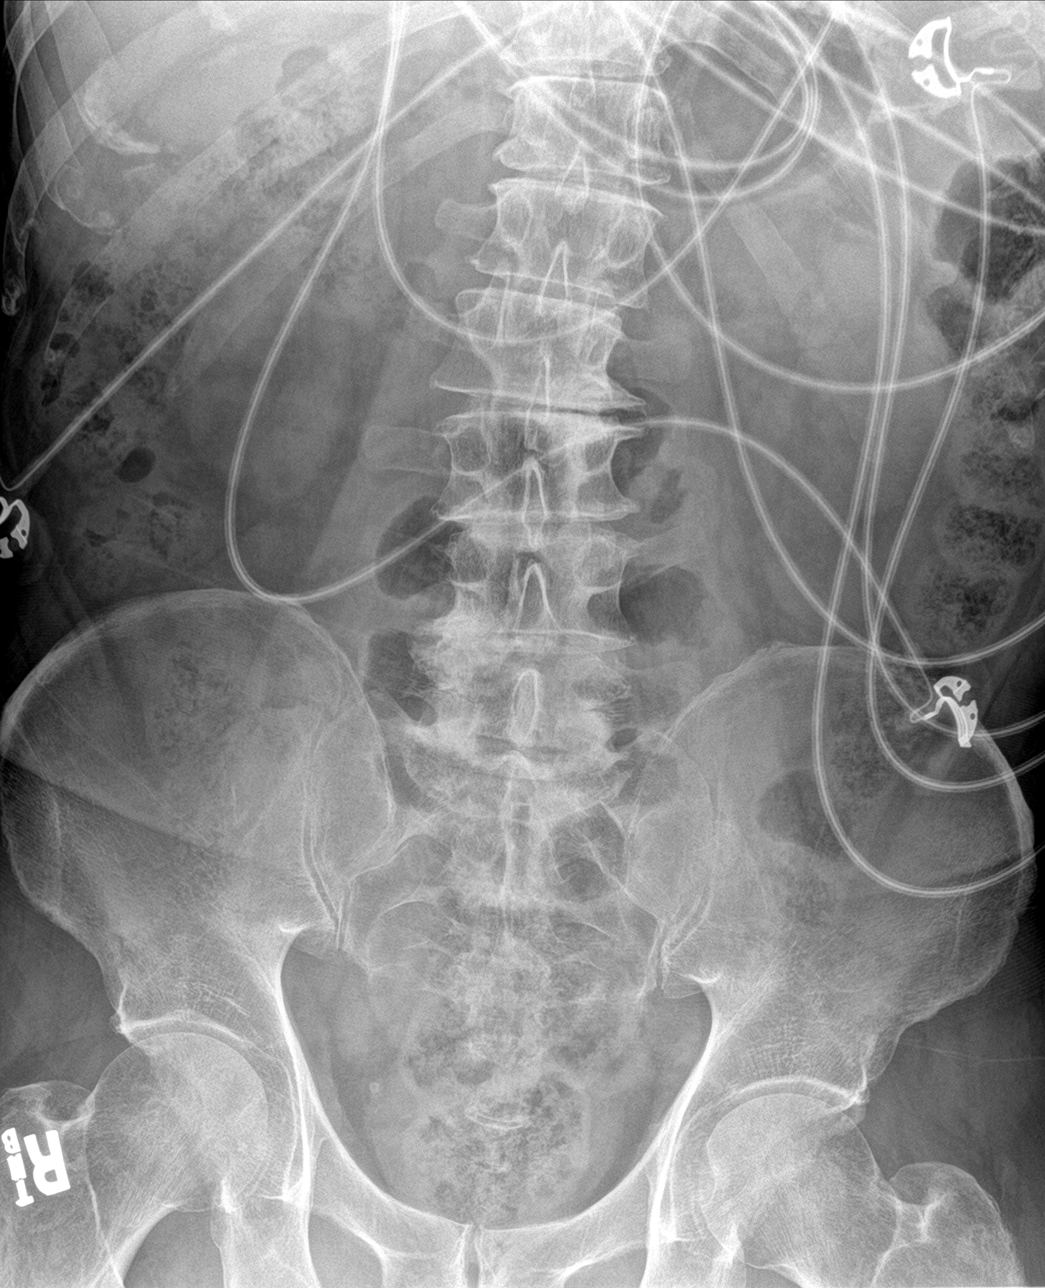

[3 of 3 positions shown; findings below may reference images not displayed]

FINDINGS: No pneumothorax. The heart, hila, and mediastinum are normal. No
pulmonary nodules or masses. No focal infiltrates.

No free air, portal venous gas, or pneumatosis. There is a paucity
of bowel gas but no evidence of obstruction. Moderate fecal loading
in the colon. No renal or ureteral stones identified. A lucent
centered calcification in the right pelvis is consistent with a
phlebolith.
IMPRESSION: 1. Moderate fecal loading in the colon.
2. No other abnormalities.

## 2021-10-24 IMAGING — CT CT ABD-PELV W/ CM
2 of 5 series · 17 of 46 positions shown, 19 images · IV contrast (Omnipaque or Isovue)
Comparison: 09/12/2019

CLINICAL DATA: Constipation since last [REDACTED], abdominal pain

EXAM:
CT ABDOMEN AND PELVIS WITH CONTRAST
TECHNIQUE: Multidetector CT imaging of the abdomen and pelvis was performed
using the standard protocol following bolus administration of
intravenous contrast.
CONTRAST:  100mL OMNIPAQUE IOHEXOL 300 MG/ML  SOLN

[Series 2: axial st · axial · 0.75mm/px · z∈[-505,-80]mm · 14 of 97 slices shown, 16 images]
[im 6/97  soft-tissue]
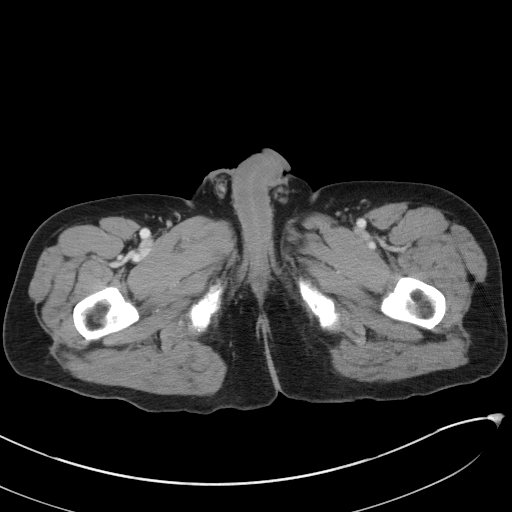
[im 6/97  bone]
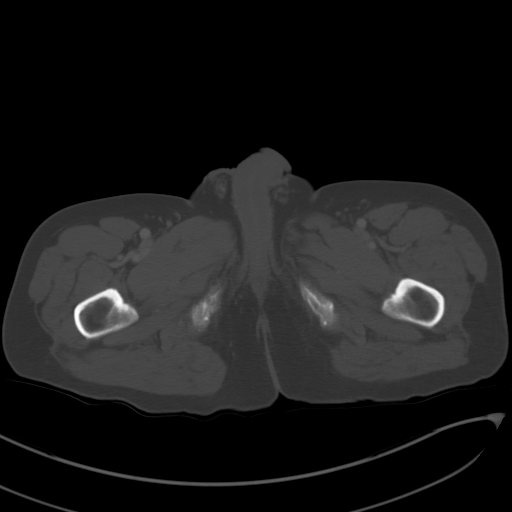
[im 12/97  soft-tissue]
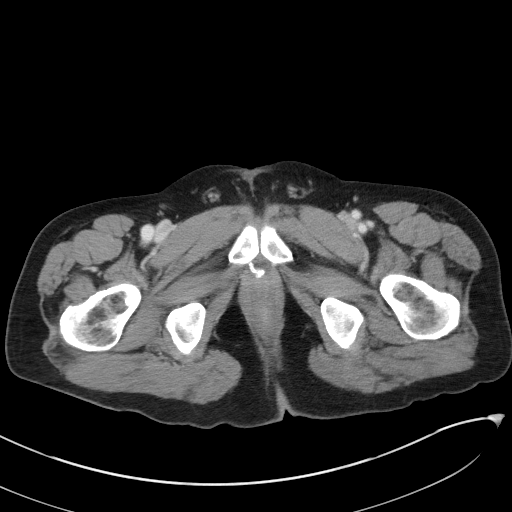
[im 17/97  soft-tissue]
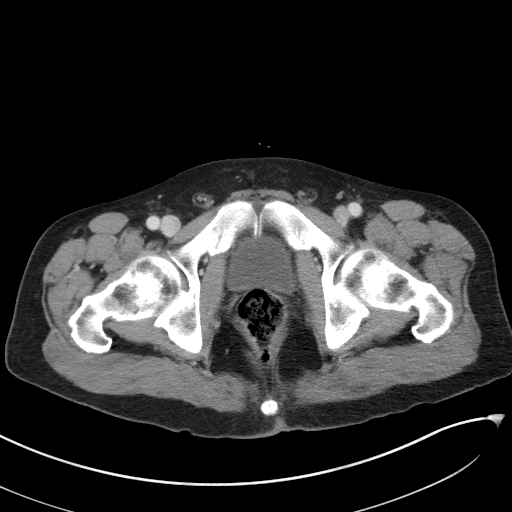
[im 29/97  soft-tissue]
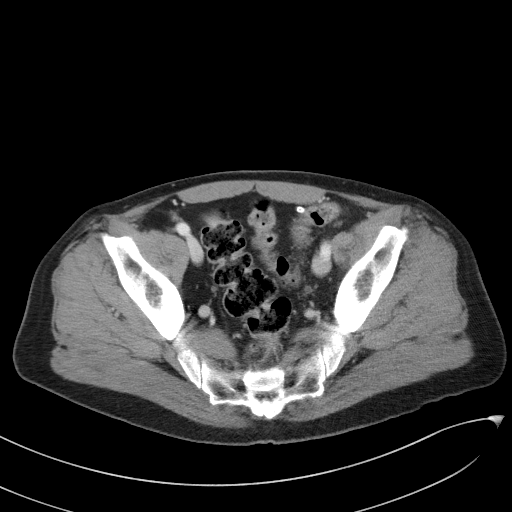
[im 34/97  soft-tissue]
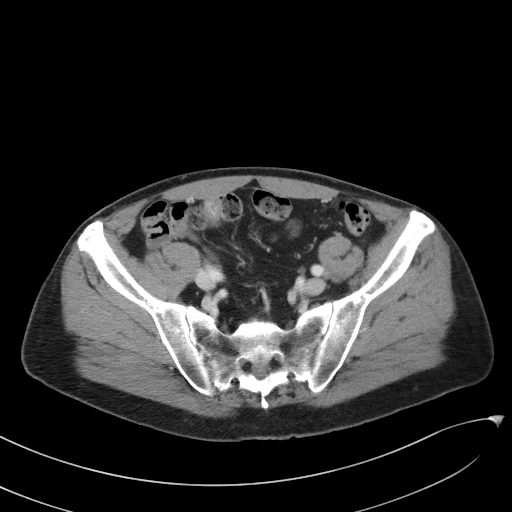
[im 40/97  soft-tissue]
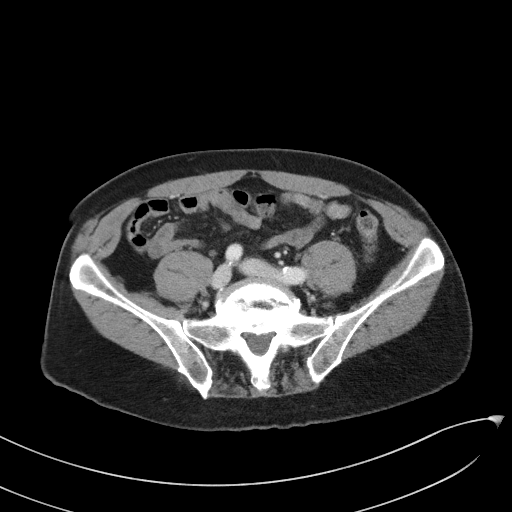
[im 46/97  soft-tissue]
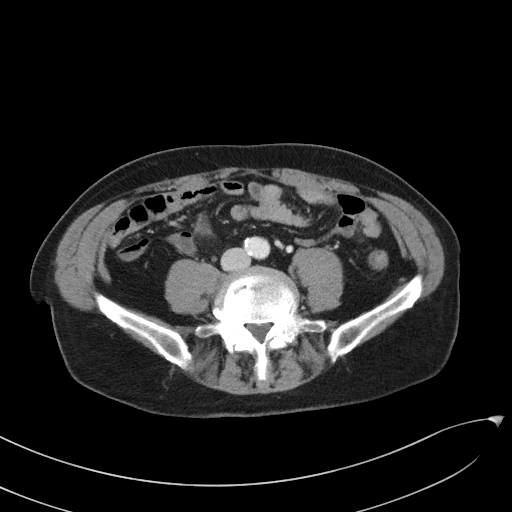
[im 51/97  soft-tissue]
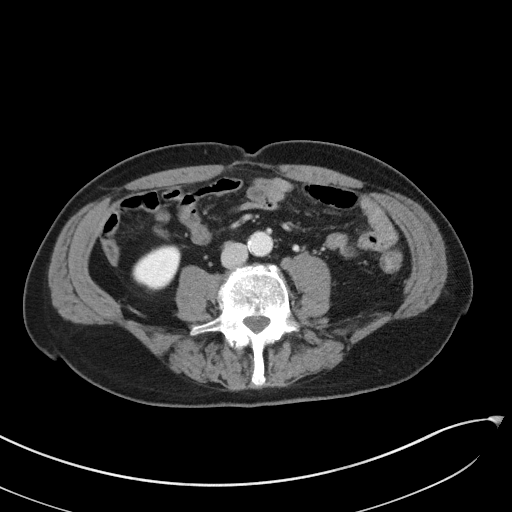
[im 57/97  soft-tissue]
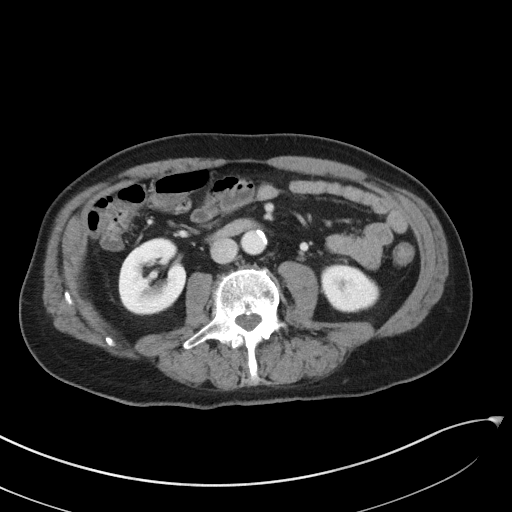
[im 57/97  bone]
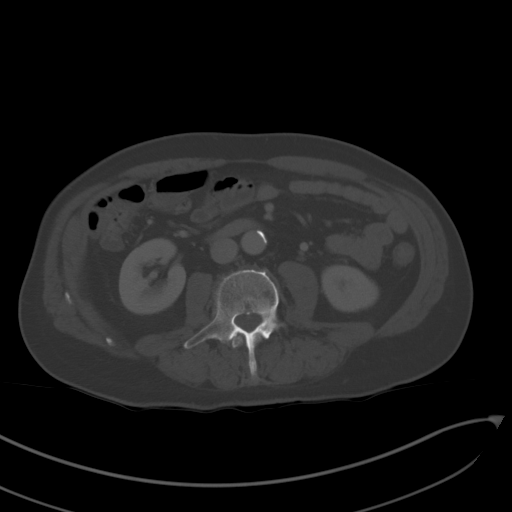
[im 63/97  soft-tissue]
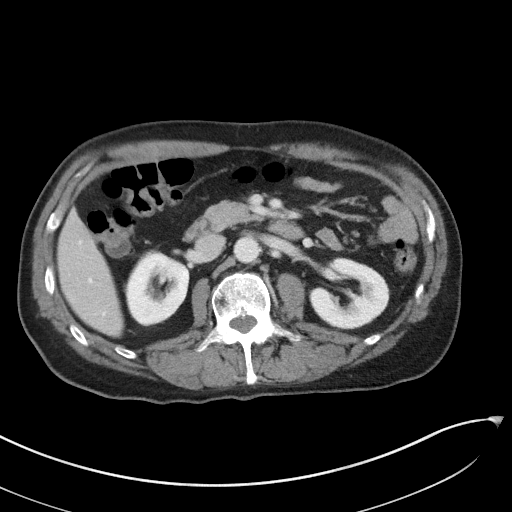
[im 74/97  soft-tissue]
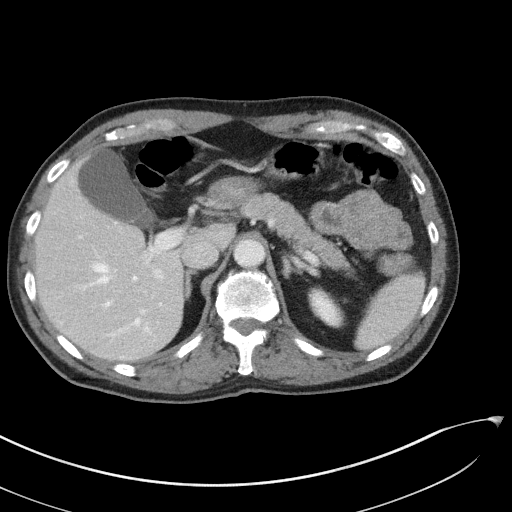
[im 80/97  soft-tissue]
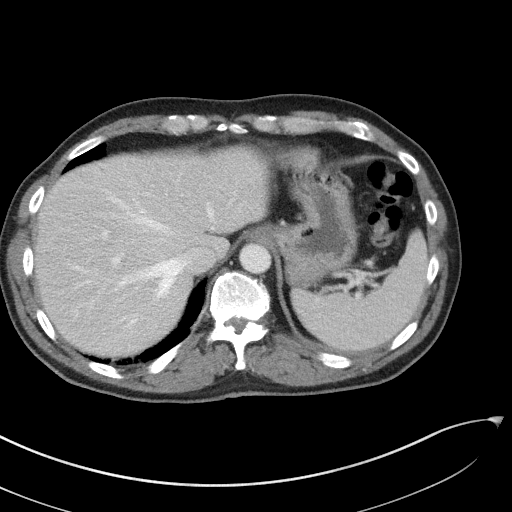
[im 85/97  soft-tissue]
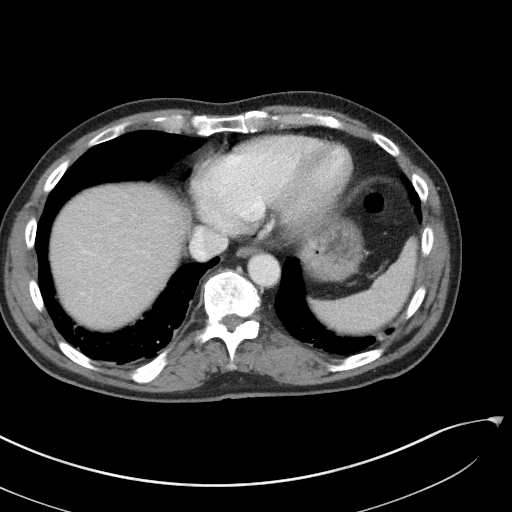
[im 91/97  soft-tissue]
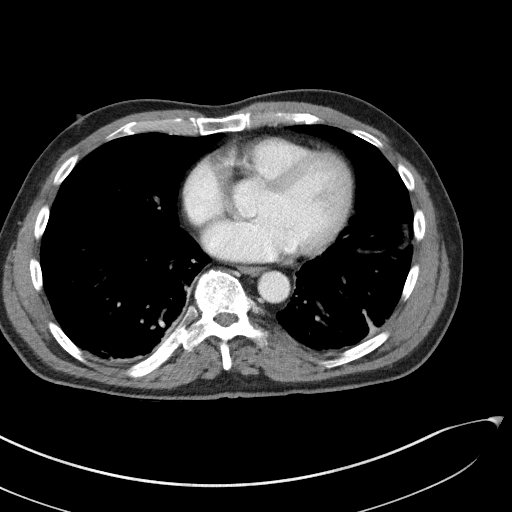

[Series 5: coronal st · coronal · 0.84mm/px · 3 of 117 slices shown]
[im 39/117  soft-tissue]
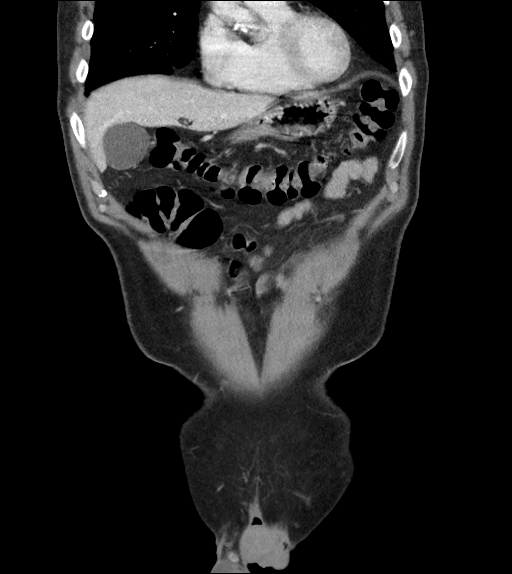
[im 52/117  soft-tissue]
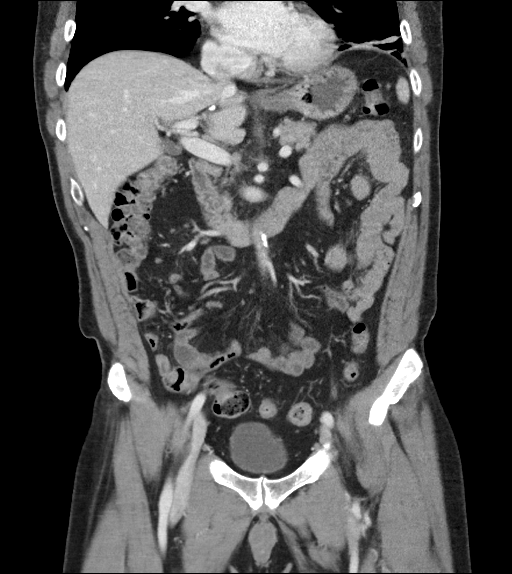
[im 65/117  soft-tissue]
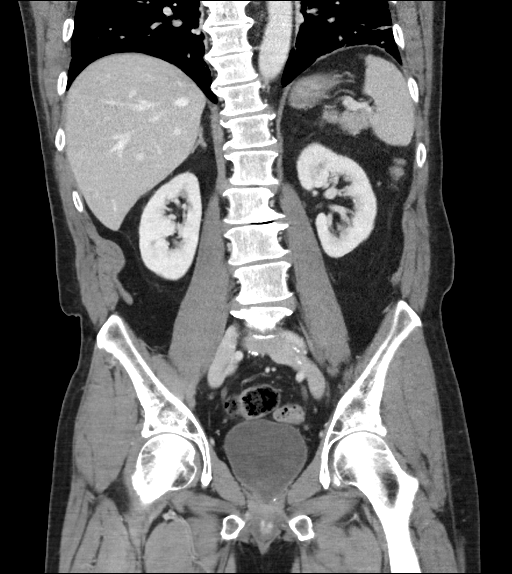

[17 of 46 positions shown; findings below may reference images not displayed]

FINDINGS: Lower chest: Hypoventilatory changes are seen at the lung bases.

Hepatobiliary: No focal liver abnormality is seen. No gallstones,
gallbladder wall thickening, or biliary dilatation.

Pancreas: Unremarkable. No pancreatic ductal dilatation or
surrounding inflammatory changes.

Spleen: Normal in size without focal abnormality.

Adrenals/Urinary Tract: Adrenal glands are unremarkable. Kidneys are
normal, without renal calculi, focal lesion, or hydronephrosis.
Bladder is unremarkable.

Stomach/Bowel: No bowel obstruction or ileus. Normal appendix right
lower quadrant. No bowel wall thickening or inflammatory change.

Vascular/Lymphatic: Aortic atherosclerosis. No enlarged abdominal or
pelvic lymph nodes.

Reproductive: Prostate is unremarkable.

Other: No abdominal wall hernia or abnormality. No abdominopelvic
ascites.

Musculoskeletal: No acute or destructive bony lesions. Reconstructed
images demonstrate no additional findings.
IMPRESSION: 1. No acute intra-abdominal or intrapelvic process.
2.  Aortic Atherosclerosis ([id number]).

## 2021-11-06 NOTE — Progress Notes (Unsigned)
Referring Provider: Asencion Noble, MD Primary Care Physician:  Asencion Noble, MD Primary GI Physician: Dr. Gala Romney  No chief complaint on file.   HPI:   Zachary Erickson is a 66 y.o. male presenting today for follow-up of constipation and to discuss scheduling colonoscopy.  Last seen in our office 09/28/2019.  He reported 4 months of constipation that has been progressive.  He had some mild associated abdominal pain and presented to the emergency room for this where she had a CT scan that did not reveal any acute process.  He took an herbal product that helped initially, but then not as effective.  Tried MiraLAX which was helpful initially, but then stopped working.  Prior to constipation, he was having bowel movements every other day.  Denies rectal bleeding.  He did report weight loss of about 10 pounds over the last several months though he had changed diet.  He was started on Linzess 145 mcg daily and planned to proceed with colonoscopy in the near future.  He later called reporting Linzess 145 mcg was too strong.  He was provided samples of Linzess 72 mcg.  He later called reporting he actually did not need the Linzess samples and was going to the bathroom normally.  We tried reaching patient to schedule colonoscopy.  He requested to call back once he figured some things out before committing to a date, but we have not received a return call.  Today:    No prior colonoscopy. No family history of colorectal cancer or colon polyps.  Past Medical History:  Diagnosis Date   Cancer [facility])    prostate cancer (2013)   Hypertension     Past Surgical History:  Procedure Laterality Date   PROSTATECTOMY      Current Outpatient Medications  Medication Sig Dispense Refill   linaclotide (LINZESS) 145 MCG CAPS capsule Take 1 capsule (145 mcg total) by mouth daily before breakfast. 30 capsule 5   Multiple Vitamin (MULTI-VITAMIN) tablet Take by mouth.     No current facility-administered  medications for this visit.    Allergies as of 11/07/2021   (No Known Allergies)    Family History  Problem Relation Age of Onset   Colon cancer Neg Hx    Colon polyps Neg Hx     Social History   Socioeconomic History   Marital status: Married    Spouse name: Not on file   Number of children: Not on file   Years of education: Not on file   Highest education level: Not on file  Occupational History   Occupation: truck driver  Tobacco Use   Smoking status: Never   Smokeless tobacco: Never  Vaping Use   Vaping Use: Never used  Substance and Sexual Activity   Alcohol use: Never   Drug use: Never   Sexual activity: Not on file  Other Topics Concern   Not on file  Social History Narrative   Not on file   Social Determinants of Health   Financial Resource Strain: Not on file  Food Insecurity: Not on file  Transportation Needs: Not on file  Physical Activity: Not on file  Stress: Not on file  Social Connections: Not on file    Review of Systems: Gen: Denies fever, chills, cold or flu like symptoms, pre-syncope, or syncope.  CV: Denies chest pain, palpitations. Resp: Denies dyspnea, cough.  GI: See HPI Heme: See HPI  Physical Exam: There were no vitals taken for this visit. General:  Alert and oriented. No distress noted. Pleasant and cooperative.  Head:  Normocephalic and atraumatic. Eyes:  Conjuctiva clear without scleral icterus. Heart:  S1, S2 present without murmurs appreciated. Lungs:  Clear to auscultation bilaterally. No wheezes, rales, or rhonchi. No distress.  Abdomen:  +BS, soft, non-tender and non-distended. No rebound or guarding. No HSM or masses noted. Msk:  Symmetrical without gross deformities. Normal posture. Extremities:  Without edema. Neurologic:  Alert and  oriented x4 Psych:  Normal mood and affect.    Assessment:     Plan:  ***   Aliene Altes, PA-C [facility] 11/07/2021

## 2021-11-07 ENCOUNTER — Encounter: Payer: Self-pay | Admitting: Gastroenterology

## 2021-11-07 ENCOUNTER — Ambulatory Visit (INDEPENDENT_AMBULATORY_CARE_PROVIDER_SITE_OTHER): Payer: Medicare Other | Admitting: Gastroenterology

## 2021-11-07 VITALS — BP 167/94 | HR 81 | Temp 97.7°F | Ht 68.0 in | Wt 172.2 lb

## 2021-11-07 DIAGNOSIS — Z1211 Encounter for screening for malignant neoplasm of colon: Secondary | ICD-10-CM

## 2021-11-07 NOTE — Patient Instructions (Addendum)
Please complete cologuard. If this is positive, we will need to schedule a colonoscopy. If negative, you will need to repeat in 3 years.   We will see you back as needed. Do not hesitate to call if you have any GI concerns.   Aliene Altes, PA-C [facility]enter[facility]

## 2021-11-19 DIAGNOSIS — Z1211 Encounter for screening for malignant neoplasm of colon: Secondary | ICD-10-CM | POA: Diagnosis not present

## 2021-11-28 LAB — COLOGUARD: COLOGUARD: NEGATIVE

## 2022-03-10 DIAGNOSIS — D2330 Other benign neoplasm of skin of unspecified part of face: Secondary | ICD-10-CM | POA: Diagnosis not present

## 2022-03-10 DIAGNOSIS — I10 Essential (primary) hypertension: Secondary | ICD-10-CM | POA: Diagnosis not present

## 2022-04-01 DIAGNOSIS — B078 Other viral warts: Secondary | ICD-10-CM | POA: Diagnosis not present

## 2022-06-11 DIAGNOSIS — M7582 Other shoulder lesions, left shoulder: Secondary | ICD-10-CM | POA: Diagnosis not present

## 2022-07-11 DIAGNOSIS — M542 Cervicalgia: Secondary | ICD-10-CM | POA: Diagnosis not present

## 2022-07-11 DIAGNOSIS — M7502 Adhesive capsulitis of left shoulder: Secondary | ICD-10-CM | POA: Diagnosis not present

## 2022-07-29 DIAGNOSIS — M25612 Stiffness of left shoulder, not elsewhere classified: Secondary | ICD-10-CM | POA: Diagnosis not present

## 2022-07-29 DIAGNOSIS — M7502 Adhesive capsulitis of left shoulder: Secondary | ICD-10-CM | POA: Diagnosis not present

## 2022-08-05 DIAGNOSIS — M7502 Adhesive capsulitis of left shoulder: Secondary | ICD-10-CM | POA: Diagnosis not present

## 2022-08-05 DIAGNOSIS — M25612 Stiffness of left shoulder, not elsewhere classified: Secondary | ICD-10-CM | POA: Diagnosis not present

## 2022-08-07 DIAGNOSIS — M7502 Adhesive capsulitis of left shoulder: Secondary | ICD-10-CM | POA: Diagnosis not present

## 2022-08-07 DIAGNOSIS — M25612 Stiffness of left shoulder, not elsewhere classified: Secondary | ICD-10-CM | POA: Diagnosis not present

## 2022-08-12 DIAGNOSIS — M7502 Adhesive capsulitis of left shoulder: Secondary | ICD-10-CM | POA: Diagnosis not present

## 2022-08-12 DIAGNOSIS — M25612 Stiffness of left shoulder, not elsewhere classified: Secondary | ICD-10-CM | POA: Diagnosis not present

## 2022-08-14 DIAGNOSIS — M25612 Stiffness of left shoulder, not elsewhere classified: Secondary | ICD-10-CM | POA: Diagnosis not present

## 2022-08-14 DIAGNOSIS — M7502 Adhesive capsulitis of left shoulder: Secondary | ICD-10-CM | POA: Diagnosis not present

## 2022-08-19 DIAGNOSIS — M7502 Adhesive capsulitis of left shoulder: Secondary | ICD-10-CM | POA: Diagnosis not present

## 2022-08-19 DIAGNOSIS — M25612 Stiffness of left shoulder, not elsewhere classified: Secondary | ICD-10-CM | POA: Diagnosis not present

## 2022-08-21 DIAGNOSIS — M25612 Stiffness of left shoulder, not elsewhere classified: Secondary | ICD-10-CM | POA: Diagnosis not present

## 2022-08-21 DIAGNOSIS — M7502 Adhesive capsulitis of left shoulder: Secondary | ICD-10-CM | POA: Diagnosis not present

## 2022-08-26 DIAGNOSIS — M7502 Adhesive capsulitis of left shoulder: Secondary | ICD-10-CM | POA: Diagnosis not present

## 2022-08-26 DIAGNOSIS — M25612 Stiffness of left shoulder, not elsewhere classified: Secondary | ICD-10-CM | POA: Diagnosis not present

## 2022-08-28 DIAGNOSIS — M25612 Stiffness of left shoulder, not elsewhere classified: Secondary | ICD-10-CM | POA: Diagnosis not present

## 2022-08-28 DIAGNOSIS — M7502 Adhesive capsulitis of left shoulder: Secondary | ICD-10-CM | POA: Diagnosis not present

## 2022-09-03 DIAGNOSIS — I700 Atherosclerosis of aorta: Secondary | ICD-10-CM | POA: Diagnosis not present

## 2022-09-03 DIAGNOSIS — I10 Essential (primary) hypertension: Secondary | ICD-10-CM | POA: Diagnosis not present

## 2022-09-03 DIAGNOSIS — M25612 Stiffness of left shoulder, not elsewhere classified: Secondary | ICD-10-CM | POA: Diagnosis not present

## 2022-09-03 DIAGNOSIS — M7502 Adhesive capsulitis of left shoulder: Secondary | ICD-10-CM | POA: Diagnosis not present

## 2022-09-03 DIAGNOSIS — E785 Hyperlipidemia, unspecified: Secondary | ICD-10-CM | POA: Diagnosis not present

## 2022-09-03 DIAGNOSIS — Z79899 Other long term (current) drug therapy: Secondary | ICD-10-CM | POA: Diagnosis not present

## 2022-09-05 DIAGNOSIS — M25612 Stiffness of left shoulder, not elsewhere classified: Secondary | ICD-10-CM | POA: Diagnosis not present

## 2022-09-05 DIAGNOSIS — M7502 Adhesive capsulitis of left shoulder: Secondary | ICD-10-CM | POA: Diagnosis not present

## 2022-09-09 DIAGNOSIS — I10 Essential (primary) hypertension: Secondary | ICD-10-CM | POA: Diagnosis not present

## 2022-09-09 DIAGNOSIS — I700 Atherosclerosis of aorta: Secondary | ICD-10-CM | POA: Diagnosis not present

## 2022-09-09 DIAGNOSIS — E785 Hyperlipidemia, unspecified: Secondary | ICD-10-CM | POA: Diagnosis not present

## 2022-09-10 DIAGNOSIS — M25612 Stiffness of left shoulder, not elsewhere classified: Secondary | ICD-10-CM | POA: Diagnosis not present

## 2022-09-10 DIAGNOSIS — M7502 Adhesive capsulitis of left shoulder: Secondary | ICD-10-CM | POA: Diagnosis not present

## 2022-09-12 DIAGNOSIS — M25612 Stiffness of left shoulder, not elsewhere classified: Secondary | ICD-10-CM | POA: Diagnosis not present

## 2022-09-12 DIAGNOSIS — M7502 Adhesive capsulitis of left shoulder: Secondary | ICD-10-CM | POA: Diagnosis not present

## 2022-09-16 DIAGNOSIS — M25612 Stiffness of left shoulder, not elsewhere classified: Secondary | ICD-10-CM | POA: Diagnosis not present

## 2022-09-16 DIAGNOSIS — M7502 Adhesive capsulitis of left shoulder: Secondary | ICD-10-CM | POA: Diagnosis not present

## 2022-09-18 DIAGNOSIS — M7502 Adhesive capsulitis of left shoulder: Secondary | ICD-10-CM | POA: Diagnosis not present

## 2022-09-18 DIAGNOSIS — M25612 Stiffness of left shoulder, not elsewhere classified: Secondary | ICD-10-CM | POA: Diagnosis not present

## 2022-09-23 DIAGNOSIS — M7502 Adhesive capsulitis of left shoulder: Secondary | ICD-10-CM | POA: Diagnosis not present

## 2022-09-23 DIAGNOSIS — M25612 Stiffness of left shoulder, not elsewhere classified: Secondary | ICD-10-CM | POA: Diagnosis not present

## 2022-09-25 DIAGNOSIS — M7502 Adhesive capsulitis of left shoulder: Secondary | ICD-10-CM | POA: Diagnosis not present

## 2022-09-25 DIAGNOSIS — M25612 Stiffness of left shoulder, not elsewhere classified: Secondary | ICD-10-CM | POA: Diagnosis not present

## 2023-01-22 ENCOUNTER — Telehealth: Payer: Self-pay | Admitting: Gastroenterology

## 2023-01-22 NOTE — Telephone Encounter (Signed)
Patient made an appt for 10/16 because he said since he retired he has been slower and slower going to the bathroom.  He wanted to know if something could be called in for him until he gets here for his appt.  [facility] [place]

## 2023-01-22 NOTE — Telephone Encounter (Signed)
Spoke to pt informed, him to try and drink cap full MiraLax once daily with plenty of fluids for a couple of days. If that does not help try to increase to twice daily. Reminded him of OV on 10/16

## 2023-01-22 NOTE — Telephone Encounter (Signed)
Agree 

## 2023-01-24 NOTE — Progress Notes (Unsigned)
    Referring Provider: Carylon Perches, MD Primary Care Physician:  Carylon Perches, MD Primary GI Physician: Dr. Jena Gauss  No chief complaint on file.   HPI:   Zachary Erickson is a 67 y.o. male with history of HTN, HLD, prostate cancer,  presenting today with chief complaint of ***  Patient had an episode of constipation back in 2021 that he later reported was due to dehydration. Last seen in the office July 2023 for screening colonoscopy, but declined and requested cologuard which was completed 11/2021 with a negative result.   Today:      Past Medical History:  Diagnosis Date   Cancer (HCC)    prostate cancer (2013)   Hypertension     Past Surgical History:  Procedure Laterality Date   PROSTATECTOMY      Current Outpatient Medications  Medication Sig Dispense Refill   atorvastatin (LIPITOR) 20 MG tablet Take 20 mg by mouth daily.     Multiple Vitamin (MULTI-VITAMIN) tablet Take by mouth.     No current facility-administered medications for this visit.    Allergies as of 01/28/2023   (No Known Allergies)    Family History  Problem Relation Age of Onset   Colon cancer Neg Hx    Colon polyps Neg Hx     Social History   Socioeconomic History   Marital status: Married    Spouse name: Not on file   Number of children: Not on file   Years of education: Not on file   Highest education level: Not on file  Occupational History   Occupation: truck driver  Tobacco Use   Smoking status: Never   Smokeless tobacco: Never  Vaping Use   Vaping status: Never Used  Substance and Sexual Activity   Alcohol use: Never   Drug use: Never   Sexual activity: Not on file  Other Topics Concern   Not on file  Social History Narrative   Not on file   Social Determinants of Health   Financial Resource Strain: Not on file  Food Insecurity: Not on file  Transportation Needs: Not on file  Physical Activity: Not on file  Stress: Not on file  Social Connections: Not on file     Review of Systems: Gen: Denies fever, chills, anorexia. Denies fatigue, weakness, weight loss.  CV: Denies chest pain, palpitations, syncope, peripheral edema, and claudication. Resp: Denies dyspnea at rest, cough, wheezing, coughing up blood, and pleurisy. GI: Denies vomiting blood, jaundice, and fecal incontinence.   Denies dysphagia or odynophagia. Derm: Denies rash, itching, dry skin Psych: Denies depression, anxiety, memory loss, confusion. No homicidal or suicidal ideation.  Heme: Denies bruising, bleeding, and enlarged lymph nodes.  Physical Exam: There were no vitals taken for this visit. General:   Alert and oriented. No distress noted. Pleasant and cooperative.  Head:  Normocephalic and atraumatic. Eyes:  Conjuctiva clear without scleral icterus. Heart:  S1, S2 present without murmurs appreciated. Lungs:  Clear to auscultation bilaterally. No wheezes, rales, or rhonchi. No distress.  Abdomen:  +BS, soft, non-tender and non-distended. No rebound or guarding. No HSM or masses noted. Msk:  Symmetrical without gross deformities. Normal posture. Extremities:  Without edema. Neurologic:  Alert and  oriented x4 Psych:  Normal mood and affect.    Assessment:     Plan:  ***   Ermalinda Memos, PA-C [facility] 01/28/2023

## 2023-01-24 NOTE — H&P (View-Only) (Signed)
Referring Provider: Carylon Perches, MD Primary Care Physician:  Carylon Perches, MD Primary GI Physician: Dr. Jena Gauss  Chief Complaint  Patient presents with   Constipation    Having issues with constipation. Knows he needs a colonoscopy, but doesn't really want it done. Had a cologuard a couple years ago and everything was fine.    HPI:   Zachary Erickson is a 67 y.o. male with history of HTN, HLD, prostate cancer,  presenting today with chief complaint of constipation.   Patient had an episode of constipation back in 2021 that he later reported was due to dehydration. Last seen in the office July 2023 for first ever screening colonoscopy, but declined and requested cologuard which was completed 11/2021 with a negative result.   Today:  Retired January 2 years ago and noticed some slowing of his bowels after that. Constipation has been a chronic intermittent issue. Has never been someone that has had a BM every day. Often skips 1-2 days, but bowels seem to be slowing down where he is having to take something every couple days to get his bowels to move as he does not want to go more than 2 days without a bowel movement.  He has taken Dulcolax, some other over-the-counter laxative, and took some old Linzess.  He does not feel that MiraLAX works well.  States when he takes the laxative or Linzess, and will clean out.  Not sure which dose of Linzess he took as he but all of his prior samples together.  No brbpr or melena. Had some abdominal pain, but when increased water intake the pain resolved. Was only drinking 2 bottles of water. Got in 7 bottles yesterday. Doesn't eat vegetables/fruit daily. Walks daily.   No unintentional weight loss.   Past Medical History:  Diagnosis Date   Cancer [facility])    prostate cancer (2013)   Hypertension     Past Surgical History:  Procedure Laterality Date   PROSTATECTOMY      Current Outpatient Medications  Medication Sig Dispense Refill   atorvastatin  (LIPITOR) 20 MG tablet Take 20 mg by mouth daily.     lisinopril (ZESTRIL) 20 MG tablet TAKE 1 TABLET BY MOUTH EVERYDAY AT BEDTIME     Multiple Vitamin (MULTI-VITAMIN) tablet Take by mouth.     No current facility-administered medications for this visit.    Allergies as of 01/28/2023   (No Known Allergies)    Family History  Problem Relation Age of Onset   Colon cancer Neg Hx    Colon polyps Neg Hx     Social History   Socioeconomic History   Marital status: Married    Spouse name: Not on file   Number of children: Not on file   Years of education: Not on file   Highest education level: Not on file  Occupational History   Occupation: truck driver  Tobacco Use   Smoking status: Never   Smokeless tobacco: Never  Vaping Use   Vaping status: Never Used  Substance and Sexual Activity   Alcohol use: Never   Drug use: Never   Sexual activity: Not on file  Other Topics Concern   Not on file  Social History Narrative   Not on file   Social Determinants of Health   Financial Resource Strain: Not on file  Food Insecurity: Not on file  Transportation Needs: Not on file  Physical Activity: Not on file  Stress: Not on file  Social Connections: Not on  file    Review of Systems: Gen: Denies fever, chills, cold or flu like symptoms, pre-syncope, or syncope.  CV: Denies chest pain, palpitations. Resp: Denies dyspnea, cough.  GI: See HPI Heme: See HPI  Physical Exam: BP 138/86 (BP Location: Right Arm, Patient Position: Sitting, Cuff Size: Normal)   Pulse 94   Temp 98.1 F (36.7 C) (Temporal)   Ht 5\' 8"  (1.727 m)   Wt 180 lb 6.4 oz (81.8 kg)   SpO2 98%   BMI 27.43 kg/m  General:   Alert and oriented. No distress noted. Pleasant and cooperative.  Head:  Normocephalic and atraumatic. Eyes:  Conjuctiva clear without scleral icterus. Heart:  S1, S2 present without murmurs appreciated. Lungs:  Clear to auscultation bilaterally. No wheezes, rales, or rhonchi. No  distress.  Abdomen:  +BS, soft, non-tender and non-distended. No rebound or guarding. No HSM or masses noted. Msk:  Symmetrical without gross deformities. Normal posture. Extremities:  Without edema. Neurologic:  Alert and  oriented x4 Psych:  Normal mood and affect.    Assessment:  67 y.o. male with history of HTN, HLD, prostate cancer, presenting today with chief complaint of constipation.  Constipation: Constipation has been a chronic, mild, intermittent problem for him, but has noted some worsening over the last couple of years since retirement.  This may be partially dietary driven with decrease watery, fruit, and vegetable intake. He is currently taking an over-the-counter laxative every couple of days to ensure that his bowels are moving.  He does not have any alarm symptoms.  He is never had a colonoscopy as he previously declined, but is now requesting to proceed with a colonoscopy as he is nervous about having a colon cancer.  Notably, Cologuard was completed in August 2023 with a negative result.   Plan:  Start Linzess 72 mcg daily 30 minutes before breakfast.  Samples provided.  Requested progress report next week. Proceed with colonoscopy with propofol by Dr. Jena Gauss in near future. The risks, benefits, and alternatives have been discussed with the patient in detail. The patient states understanding and desires to proceed.  ASA 2 Follow-up after colonoscopy.   Ermalinda Memos, PA-C [facility]enter[facility] 01/28/2023

## 2023-01-28 ENCOUNTER — Encounter: Payer: Self-pay | Admitting: Gastroenterology

## 2023-01-28 ENCOUNTER — Encounter: Payer: Self-pay | Admitting: *Deleted

## 2023-01-28 ENCOUNTER — Other Ambulatory Visit: Payer: Self-pay | Admitting: *Deleted

## 2023-01-28 ENCOUNTER — Ambulatory Visit: Payer: Medicare Other | Admitting: Gastroenterology

## 2023-01-28 VITALS — BP 138/86 | HR 94 | Temp 98.1°F | Ht 68.0 in | Wt 180.4 lb

## 2023-01-28 DIAGNOSIS — Z1211 Encounter for screening for malignant neoplasm of colon: Secondary | ICD-10-CM

## 2023-01-28 DIAGNOSIS — K5900 Constipation, unspecified: Secondary | ICD-10-CM

## 2023-01-28 DIAGNOSIS — K5909 Other constipation: Secondary | ICD-10-CM | POA: Diagnosis not present

## 2023-01-28 MED ORDER — PEG 3350-KCL-NA BICARB-NACL 420 G PO SOLR: 4000.0000 mL | Freq: Once | ORAL | 0 refills | Status: AC

## 2023-01-28 NOTE — Patient Instructions (Addendum)
Start Linzess 72 mcg daily.  Be sure to take this medication on an empty stomach, only 30 minutes before breakfast.  As we discussed, Linzess can cause diarrhea, but this should taper off in the first 1 to 2 weeks.  We will get you scheduled for a colonoscopy with Dr. Jena Gauss at [facility].   We will follow-up with you in the office after your colonoscopy.   It was good to see you today!   Ermalinda Memos, PA-C [facility]enter[facility]

## 2023-01-30 ENCOUNTER — Telehealth: Payer: Self-pay | Admitting: *Deleted

## 2023-01-30 NOTE — Telephone Encounter (Signed)
Pt called and states Linzess is giving him diarrhea. He stated he wanted a lower dose. Informed that was the lowest dose and to give it a couple more days, because it is cleaning him out and diarrhea should slow down in a week or so. Informed to call back next Wednesday with an update.

## 2023-02-10 ENCOUNTER — Telehealth: Payer: Self-pay | Admitting: *Deleted

## 2023-02-10 NOTE — Telephone Encounter (Signed)
Pt called and states he is not having regular bowel movements and waiting to see if Linzess will be approved. He informed me that he is taking MiraLax twice daily. Informed he could come and get some samples of Linzess until he finds out if he is approved. Informed to drink plenty of fluids.

## 2023-02-10 NOTE — Telephone Encounter (Signed)
I have never sent in a Rx for Linzess as we were waiting on a progress report. I can send in a Rx for him. Did he complete patient assistance paperwork? If so, I wasn't aware.

## 2023-02-11 ENCOUNTER — Other Ambulatory Visit: Payer: Self-pay | Admitting: Gastroenterology

## 2023-02-11 DIAGNOSIS — K5904 Chronic idiopathic constipation: Secondary | ICD-10-CM

## 2023-02-11 MED ORDER — LINACLOTIDE 72 MCG PO CAPS: 72.0000 ug | ORAL_CAPSULE | Freq: Every day | ORAL | 5 refills | Status: DC

## 2023-02-11 NOTE — Telephone Encounter (Signed)
Rx sent 

## 2023-02-11 NOTE — Telephone Encounter (Signed)
Spoke to pt, he informed me that Linzess is working well and would [facility] [place]. And if he can not afford it he will call and let me know.

## 2023-02-18 ENCOUNTER — Telehealth: Payer: Self-pay | Admitting: *Deleted

## 2023-02-18 ENCOUNTER — Other Ambulatory Visit: Payer: Self-pay | Admitting: Gastroenterology

## 2023-02-18 DIAGNOSIS — K5904 Chronic idiopathic constipation: Secondary | ICD-10-CM

## 2023-02-18 MED ORDER — LINACLOTIDE 145 MCG PO CAPS: 145.0000 ug | ORAL_CAPSULE | Freq: Every day | ORAL | 3 refills | Status: DC

## 2023-02-18 NOTE — Telephone Encounter (Signed)
Pt called and states that Linzess stop working for him and would like to go up to Linzess .

## 2023-02-18 NOTE — Telephone Encounter (Signed)
I have sent in a new Rx for him. He can take 2 of his Linzess 72 mcg together each morning until he runs out of the 72s, then start the new 145 mcg dose. This way he doesn't have to waste the medication he just picked up.

## 2023-02-19 ENCOUNTER — Ambulatory Visit (HOSPITAL_COMMUNITY): Payer: Medicare Other | Admitting: Certified Registered"

## 2023-02-19 ENCOUNTER — Encounter (HOSPITAL_COMMUNITY)
Admission: RE | Disposition: A | Discharge status: Home / Self Care | Payer: Self-pay | Source: Home / Self Care | Attending: Internal Medicine

## 2023-02-19 ENCOUNTER — Encounter (HOSPITAL_COMMUNITY): Payer: Self-pay | Admitting: Internal Medicine

## 2023-02-19 ENCOUNTER — Other Ambulatory Visit: Payer: Self-pay

## 2023-02-19 ENCOUNTER — Ambulatory Visit (HOSPITAL_COMMUNITY)
Admission: RE | Admit: 2023-02-19 | Discharge: 2023-02-19 | Disposition: A | Discharge status: Home / Self Care | Payer: Medicare Other | Attending: Internal Medicine | Admitting: Internal Medicine

## 2023-02-19 DIAGNOSIS — K635 Polyp of colon: Secondary | ICD-10-CM | POA: Diagnosis not present

## 2023-02-19 DIAGNOSIS — E785 Hyperlipidemia, unspecified: Secondary | ICD-10-CM | POA: Diagnosis not present

## 2023-02-19 DIAGNOSIS — Z8546 Personal history of malignant neoplasm of prostate: Secondary | ICD-10-CM | POA: Diagnosis not present

## 2023-02-19 DIAGNOSIS — D125 Benign neoplasm of sigmoid colon: Secondary | ICD-10-CM | POA: Insufficient documentation

## 2023-02-19 DIAGNOSIS — D126 Benign neoplasm of colon, unspecified: Secondary | ICD-10-CM

## 2023-02-19 DIAGNOSIS — I10 Essential (primary) hypertension: Secondary | ICD-10-CM | POA: Diagnosis not present

## 2023-02-19 DIAGNOSIS — K5900 Constipation, unspecified: Secondary | ICD-10-CM | POA: Insufficient documentation

## 2023-02-19 DIAGNOSIS — Z1211 Encounter for screening for malignant neoplasm of colon: Secondary | ICD-10-CM

## 2023-02-19 HISTORY — PX: POLYPECTOMY: SHX5525

## 2023-02-19 HISTORY — PX: COLONOSCOPY WITH PROPOFOL: SHX5780

## 2023-02-19 SURGERY — COLONOSCOPY WITH PROPOFOL
Anesthesia: General

## 2023-02-19 MED ORDER — LIDOCAINE HCL (CARDIAC) PF 100 MG/5ML IV SOSY
PREFILLED_SYRINGE | INTRAVENOUS | Status: DC | PRN
  Administered 2023-02-19 (×2): 60 mg via INTRAVENOUS

## 2023-02-19 MED ORDER — PROPOFOL 500 MG/50ML IV EMUL
INTRAVENOUS | Status: DC | PRN
  Administered 2023-02-19: 150 ug/kg/min via INTRAVENOUS

## 2023-02-19 MED ORDER — PHENYLEPHRINE 80 MCG/ML (10ML) SYRINGE FOR IV PUSH (FOR BLOOD PRESSURE SUPPORT)
PREFILLED_SYRINGE | INTRAVENOUS | Status: DC | PRN
  Administered 2023-02-19 (×4): 160 ug via INTRAVENOUS

## 2023-02-19 MED ORDER — STERILE WATER FOR IRRIGATION IR SOLN
Status: DC | PRN
  Administered 2023-02-19: 60 mL

## 2023-02-19 MED ORDER — ESMOLOL HCL 100 MG/10ML IV SOLN
INTRAVENOUS | Status: DC | PRN
  Administered 2023-02-19: 20 mg via INTRAVENOUS

## 2023-02-19 MED ORDER — PROPOFOL 10 MG/ML IV BOLUS
INTRAVENOUS | Status: DC | PRN
  Administered 2023-02-19: 100 mg via INTRAVENOUS
  Administered 2023-02-19: 40 mg via INTRAVENOUS

## 2023-02-19 MED ORDER — VASOPRESSIN 20 UNIT/ML IV SOLN
INTRAVENOUS | Status: DC | PRN
  Administered 2023-02-19: 2 [IU] via INTRAVENOUS
  Administered 2023-02-19 (×2): 1 [IU] via INTRAVENOUS

## 2023-02-19 MED ORDER — LACTATED RINGERS IV SOLN: INTRAVENOUS | Status: DC | PRN

## 2023-02-19 MED ORDER — EPHEDRINE SULFATE-NACL 50-0.9 MG/10ML-% IV SOSY
PREFILLED_SYRINGE | INTRAVENOUS | Status: DC | PRN
  Administered 2023-02-19 (×2): 10 mg via INTRAVENOUS

## 2023-02-19 MED ORDER — LIDOCAINE HCL (PF) 2 % IJ SOLN
INTRAMUSCULAR | Status: AC
Start: 2023-02-19 — End: ?
  Filled 2023-02-19: qty 5

## 2023-02-19 MED ORDER — SODIUM CHLORIDE 0.9% FLUSH: 10.0000 mL | Freq: Two times a day (BID) | INTRAVENOUS | Status: DC

## 2023-02-19 MED ORDER — EPHEDRINE 5 MG/ML INJ
INTRAVENOUS | Status: AC
  Filled 2023-02-19: qty 5

## 2023-02-19 MED ORDER — PROPOFOL 1000 MG/100ML IV EMUL
INTRAVENOUS | Status: AC
  Filled 2023-02-19: qty 100

## 2023-02-19 MED ORDER — SODIUM CHLORIDE FLUSH 0.9 % IV SOLN
INTRAVENOUS | Status: AC
  Filled 2023-02-19: qty 20

## 2023-02-19 NOTE — Progress Notes (Signed)
Dr. Johnnette Litter in to assess pt post op.  Last BP 104/57.  Pt alert, wife at bedside, denies any discomfort. Per Dr. Johnnette Litter, pt should drink plenty of fluids today and not take any BP meds until tomorrow.  Pt and wife verbalized understanding.  OK to discharge per Dr. Johnnette Litter.

## 2023-02-19 NOTE — Transfer of Care (Addendum)
Immediate Anesthesia Transfer of Care Note  Patient: Zachary Erickson  Procedure(s) Performed: COLONOSCOPY WITH PROPOFOL POLYPECTOMY  Patient Location: Endoscopy Unit  Anesthesia Type:General  Level of Consciousness: awake, alert , and patient cooperative  Airway & Oxygen Therapy: Patient Spontanous Breathing  Post-op Assessment: Report given to RN and Post -op Vital signs reviewed and stable  Post vital signs: Reviewed and stable  Last Vitals:  Vitals Value Taken Time  BP 99/66 02/19/23 0821  Temp 36.7 C 02/19/23 0812  Pulse 66 02/19/23 0821  Resp 13 02/19/23 0821  SpO2 95 % 02/19/23 0821    Last Pain:  Vitals:   02/19/23 0812  TempSrc: Oral  PainSc: 0-No pain      Patients Stated Pain Goal: 8 (02/19/23 0653)  Complications: Repeat blood pressures elevated 170's-180's/ 130's-140's. Esmolol 20 mg administered.  Subsequent blood pressures 90's/60's x 3. Patient alert oriented and interactive with staff.

## 2023-02-19 NOTE — Interval H&P Note (Signed)
History and Physical Interval Note:  02/19/2023 7:27 AM  Allena Earing  has presented today for surgery, with the diagnosis of screening for colon cancer.  The various methods of treatment have been discussed with the patient and family. After consideration of risks, benefits and other options for treatment, the patient has consented to  Procedure(s) with comments: COLONOSCOPY WITH PROPOFOL (N/A) - 7:30 am, asa 2 as a surgical intervention.  The patient's history has been reviewed, patient examined, no change in status, stable for surgery.  I have reviewed the patient's chart and labs.  Questions were answered to the patient's satisfaction.     Tosca Pletz No change.  Patient here for a screening colonoscopy.  Linzess 145 added to his regimen to facilitate has been of constipation. The risks, benefits, limitations, alternatives and imponderables have been reviewed with the patient. Questions have been answered. All parties are agreeable.

## 2023-02-19 NOTE — Telephone Encounter (Signed)
Noted informed pt.  

## 2023-02-19 NOTE — Anesthesia Preprocedure Evaluation (Signed)

## 2023-02-19 NOTE — Op Note (Signed)
[facility] Patient Name: Zachary Erickson Procedure Date: 02/19/2023 7:02 AM MRN: [id number] Date of Birth: 10-Nov-1955 Attending MD: Gennette Pac , MD, [id number] CSN: [id number] Age: 67 Admit Type: Outpatient Procedure:                Colonoscopy Indications:              Screening for colorectal malignant neoplasm Providers:                Gennette Pac, MD, Francoise Ceo RN, RN, Dyann Ruddle Referring MD:              Medicines:                Propofol per Anesthesia Complications:            No immediate complications. Estimated Blood Loss:     Estimated blood loss was minimal. Procedure:                Pre-Anesthesia Assessment:                           - Prior to the procedure, a History and Physical                            was performed, and patient medications and                            allergies were reviewed. The patient's tolerance of                            previous anesthesia was also reviewed. The risks                            and benefits of the procedure and the sedation                            options and risks were discussed with the patient.                            All questions were answered, and informed consent                            was obtained. Prior Anticoagulants: The patient has                            taken no anticoagulant or antiplatelet agents. ASA                            Grade Assessment: III - A patient with severe                            systemic disease. After reviewing the risks and  benefits, the patient was deemed in satisfactory                            condition to undergo the procedure.                           After obtaining informed consent, the colonoscope                            was passed under direct vision. Throughout the                            procedure, the patient's blood pressure, pulse, and                             oxygen saturations were monitored continuously. The                            [id number]) scope was introduced through                            the anus and advanced to the the cecum, identified                            by appendiceal orifice and ileocecal valve. The                            colonoscopy was performed without difficulty. The                            patient tolerated the procedure well. The quality                            of the bowel preparation was adequate. The                            ileocecal valve, appendiceal orifice, and rectum                            were photographed. The colonoscopy was performed                            without difficulty. The patient tolerated the                            procedure well. The quality of the bowel                            preparation was adequate. Scope In: 7:41:02 AM Scope Out: 7:54:33 AM Scope Withdrawal Time: 0 hours 9 minutes 53 seconds  Total Procedure Duration: 0 hours 13 minutes 31 seconds  Findings:      The perianal and digital rectal examinations were normal.      A 4 mm polyp was found in the mid sigmoid colon. The polyp was  sessile.       The polyp was removed with a cold snare. Resection and retrieval were       complete. Estimated blood loss was minimal.      The exam was otherwise without abnormality on direct and retroflexion       views. Impression:               - One 4 mm polyp in the mid sigmoid colon, removed                            with a cold snare. Resected and retrieved.                           - The examination was otherwise normal on direct                            and retroflexion views. Moderate Sedation:      Moderate (conscious) sedation was personally administered by an       anesthesia professional. The following parameters were monitored: oxygen       saturation, heart rate, blood pressure, respiratory rate, EKG, adequacy       of pulmonary ventilation,  and response to care. Recommendation:           - Patient has a contact number available for                            emergencies. The signs and symptoms of potential                            delayed complications were discussed with the                            patient. Return to normal activities tomorrow.                            Written discharge instructions were provided to the                            patient.                           - Advance diet as tolerated.                           - Continue present medications.                           - Repeat colonoscopy date to be determined after                            pending pathology results are reviewed for                            surveillance.                           - Return  to GI office in 3 months. Procedure Code(s):        --- Professional ---                           [phone], Colonoscopy, flexible; with removal of                            tumor(s), polyp(s), or other lesion(s) by snare                            technique Diagnosis Code(s):        --- Professional ---                           Z12.11, Encounter for screening for malignant                            neoplasm of colon                           D12.5, Benign neoplasm of sigmoid colon CPT copyright 2022 American Medical Association. All rights reserved. The codes documented in this report are preliminary and upon coder review may  be revised to meet current compliance requirements. Gerrit Friends. Kinzie Wickes, MD Gennette Pac, MD 02/19/2023 8:03:52 AM This report has been signed electronically. Number of Addenda: 0

## 2023-02-19 NOTE — Discharge Instructions (Addendum)
  Colonoscopy Discharge Instructions  Read the instructions outlined below and refer to this sheet in the next few weeks. These discharge instructions provide you with general information on caring for yourself after you leave the hospital. Your doctor may also give you specific instructions. While your treatment has been planned according to the most current medical practices available, unavoidable complications occasionally occur. If you have any problems or questions after discharge, call Dr. Jena Gauss at [phone]. ACTIVITY You may resume your regular activity, but move at a slower pace for the next 24 hours.  Take frequent rest periods for the next 24 hours.  Walking will help get rid of the air and reduce the bloated feeling in your belly (abdomen).  No driving for 24 hours (because of the medicine (anesthesia) used during the test).   Do not sign any important legal documents or operate any machinery for 24 hours (because of the anesthesia used during the test).  NUTRITION Drink plenty of fluids.  You may resume your normal diet as instructed by your doctor.  Begin with a light meal and progress to your normal diet. Heavy or fried foods are harder to digest and may make you feel sick to your stomach (nauseated).  Avoid alcoholic beverages for 24 hours or as instructed.  MEDICATIONS You may resume your normal medications unless your doctor tells you otherwise.  WHAT YOU CAN EXPECT TODAY Some feelings of bloating in the abdomen.  Passage of more gas than usual.  Spotting of blood in your stool or on the toilet paper.  IF YOU HAD POLYPS REMOVED DURING THE COLONOSCOPY: No aspirin products for 7 days or as instructed.  No alcohol for 7 days or as instructed.  Eat a soft diet for the next 24 hours.  FINDING OUT THE RESULTS OF YOUR TEST Not all test results are available during your visit. If your test results are not back during the visit, make an appointment with your caregiver to find out the  results. Do not assume everything is normal if you have not heard from your caregiver or the medical facility. It is important for you to follow up on all of your test results.  SEEK IMMEDIATE MEDICAL ATTENTION IF: You have more than a spotting of blood in your stool.  Your belly is swollen (abdominal distention).  You are nauseated or vomiting.  You have a temperature over 101.  You have abdominal pain or discomfort that is severe or gets worse throughout the day.     Your colon looked good today except for 1 small polyp which I removed  Further recommendations to follow after I get lab report back on the polyp  Continue Linzess 145 daily  Office visit with [place] in 3 months  OFFICE TO CALL WITH APPOINTMENT  At patient request, I called Debbie at [phone] -reviewed findings and recommendations

## 2023-02-20 LAB — SURGICAL PATHOLOGY

## 2023-02-21 NOTE — Anesthesia Postprocedure Evaluation (Signed)
Anesthesia Post Note  Patient: Zachary Erickson  Procedure(s) Performed: COLONOSCOPY WITH PROPOFOL POLYPECTOMY  Patient location during evaluation: Phase II Anesthesia Type: General Level of consciousness: awake Pain management: pain level controlled Vital Signs Assessment: post-procedure vital signs reviewed and stable Respiratory status: spontaneous breathing and respiratory function stable Cardiovascular status: blood pressure returned to baseline and stable Postop Assessment: no headache and no apparent nausea or vomiting Anesthetic complications: no Comments: Late entry   No notable events documented.   Last Vitals:  Vitals:   02/19/23 0827 02/19/23 0829  BP: (!) 90/58 (!) 104/57  Pulse:    Resp:    Temp:    SpO2:      Last Pain:  Vitals:   02/19/23 0827  TempSrc:   PainSc: 0-No pain                 Windell Norfolk

## 2023-02-26 ENCOUNTER — Encounter (HOSPITAL_COMMUNITY): Payer: Self-pay | Admitting: Internal Medicine

## 2023-02-28 ENCOUNTER — Encounter: Payer: Self-pay | Admitting: Internal Medicine

## 2023-03-10 DIAGNOSIS — I10 Essential (primary) hypertension: Secondary | ICD-10-CM | POA: Diagnosis not present

## 2023-03-10 DIAGNOSIS — Z860101 Personal history of adenomatous and serrated colon polyps: Secondary | ICD-10-CM | POA: Diagnosis not present

## 2023-03-10 DIAGNOSIS — R202 Paresthesia of skin: Secondary | ICD-10-CM | POA: Diagnosis not present

## 2023-04-24 ENCOUNTER — Encounter: Payer: Self-pay | Admitting: Internal Medicine

## 2023-06-07 NOTE — Progress Notes (Unsigned)
 Referring Provider: Carylon Perches, MD Primary Care Physician:  Carylon Perches, MD Primary GI Physician: Dr. Jena Gauss  No chief complaint on file.   HPI:   Zachary Erickson is a 68 y.o. male presenting today for follow-up of constipation s/p colonoscopy for screening purposes.   Last seen in the office 01/28/23. Recommended Linzess 72 mcg for constipation and screening colonoscopy was scheduled.   Linzess later increased to 145 mcg daily.   Colonoscopy 02/19/23: One 4 mm polyp in the mid sigmoid colon resected and retried. Pathology with tubular adenoma. Recommended repeat colonoscopy in 7 years.   Today:    Past Medical History:  Diagnosis Date   Cancer (HCC)    prostate cancer (2013)   Hypertension     Past Surgical History:  Procedure Laterality Date   COLONOSCOPY WITH PROPOFOL N/A 02/19/2023   Procedure: COLONOSCOPY WITH PROPOFOL;  Surgeon: Corbin Ade, MD;  [facility];  Service: Endoscopy;  Laterality: N/A;  7:30 am, asa 2   POLYPECTOMY  02/19/2023   Procedure: POLYPECTOMY;  Surgeon: Corbin Ade, MD;  Location: [facility];  Service: Endoscopy;;   PROSTATECTOMY      Current Outpatient Medications  Medication Sig Dispense Refill   atorvastatin (LIPITOR) 20 MG tablet Take 20 mg by mouth daily.     linaclotide (LINZESS) 145 MCG CAPS capsule Take 1 capsule (145 mcg total) by mouth daily before breakfast. 30 capsule 3   lisinopril (ZESTRIL) 20 MG tablet TAKE 1 TABLET BY MOUTH EVERYDAY AT BEDTIME     Multiple Vitamin (MULTI-VITAMIN) tablet Take by mouth.     No current facility-administered medications for this visit.    Allergies as of 06/10/2023   (No Known Allergies)    Family History  Problem Relation Age of Onset   Colon cancer Neg Hx    Colon polyps Neg Hx     Social History   Socioeconomic History   Marital status: Married    Spouse name: Not on file   Number of children: Not on file   Years of education: Not on file   Highest  education level: Not on file  Occupational History   Occupation: truck driver  Tobacco Use   Smoking status: Never   Smokeless tobacco: Never  Vaping Use   Vaping status: Never Used  Substance and Sexual Activity   Alcohol use: Never   Drug use: Never   Sexual activity: Not on file  Other Topics Concern   Not on file  Social History Narrative   Not on file   Social Drivers of Health   Financial Resource Strain: Not on file  Food Insecurity: Not on file  Transportation Needs: Not on file  Physical Activity: Not on file  Stress: Not on file  Social Connections: Not on file    Review of Systems: Gen: Denies fever, chills, anorexia. Denies fatigue, weakness, weight loss.  CV: Denies chest pain, palpitations, syncope, peripheral edema, and claudication. Resp: Denies dyspnea at rest, cough, wheezing, coughing up blood, and pleurisy. GI: Denies vomiting blood, jaundice, and fecal incontinence.   Denies dysphagia or odynophagia. Derm: Denies rash, itching, dry skin Psych: Denies depression, anxiety, memory loss, confusion. No homicidal or suicidal ideation.  Heme: Denies bruising, bleeding, and enlarged lymph nodes.  Physical Exam: There were no vitals taken for this visit. General:   Alert and oriented. No distress noted. Pleasant and cooperative.  Head:  Normocephalic and atraumatic. Eyes:  Conjuctiva clear without scleral icterus.  Heart:  S1, S2 present without murmurs appreciated. Lungs:  Clear to auscultation bilaterally. No wheezes, rales, or rhonchi. No distress.  Abdomen:  +BS, soft, non-tender and non-distended. No rebound or guarding. No HSM or masses noted. Msk:  Symmetrical without gross deformities. Normal posture. Extremities:  Without edema. Neurologic:  Alert and  oriented x4 Psych:  Normal mood and affect.    Assessment:     Plan:  ***   Ermalinda Memos, PA-C [facility] 06/10/2023

## 2023-06-10 ENCOUNTER — Encounter: Payer: Self-pay | Admitting: Gastroenterology

## 2023-06-10 ENCOUNTER — Ambulatory Visit (INDEPENDENT_AMBULATORY_CARE_PROVIDER_SITE_OTHER): Payer: Medicare Other | Admitting: Gastroenterology

## 2023-06-10 VITALS — BP 163/92 | HR 79 | Temp 97.9°F | Ht 69.0 in | Wt 183.4 lb

## 2023-06-10 DIAGNOSIS — R202 Paresthesia of skin: Secondary | ICD-10-CM | POA: Diagnosis not present

## 2023-06-10 DIAGNOSIS — K5909 Other constipation: Secondary | ICD-10-CM

## 2023-06-10 DIAGNOSIS — K5904 Chronic idiopathic constipation: Secondary | ICD-10-CM

## 2023-06-10 NOTE — Patient Instructions (Addendum)
 Continue increasing your water and fiber intake to keep your bowels moving well.   Drink at least 64 oz of water daily.   Consume plenty of fruits, vegetables, whole grains to maintain adequate fiber intake.   You can try adding benefiber 3 teaspoons daily to help keep your bowels moving.   It was good to see you again today!   We can follow-up with you in 6 months or sooner if needed.   Ermalinda Memos, PA-C [facility]

## 2023-07-08 DIAGNOSIS — H25813 Combined forms of age-related cataract, bilateral: Secondary | ICD-10-CM | POA: Diagnosis not present

## 2023-08-06 DIAGNOSIS — R600 Localized edema: Secondary | ICD-10-CM | POA: Diagnosis not present

## 2023-08-06 DIAGNOSIS — I10 Essential (primary) hypertension: Secondary | ICD-10-CM | POA: Diagnosis not present

## 2023-09-03 DIAGNOSIS — R202 Paresthesia of skin: Secondary | ICD-10-CM | POA: Diagnosis not present

## 2023-09-03 DIAGNOSIS — I10 Essential (primary) hypertension: Secondary | ICD-10-CM | POA: Diagnosis not present

## 2023-09-03 DIAGNOSIS — Z79899 Other long term (current) drug therapy: Secondary | ICD-10-CM | POA: Diagnosis not present

## 2023-09-10 DIAGNOSIS — I10 Essential (primary) hypertension: Secondary | ICD-10-CM | POA: Diagnosis not present

## 2023-09-10 DIAGNOSIS — E785 Hyperlipidemia, unspecified: Secondary | ICD-10-CM | POA: Diagnosis not present

## 2023-09-10 DIAGNOSIS — I700 Atherosclerosis of aorta: Secondary | ICD-10-CM | POA: Diagnosis not present

## 2023-10-06 ENCOUNTER — Ambulatory Visit: Admitting: Neurology

## 2023-10-06 ENCOUNTER — Encounter: Payer: Self-pay | Admitting: Neurology

## 2023-10-06 VITALS — BP 158/86 | HR 79 | Ht 69.0 in | Wt 182.0 lb

## 2023-10-06 DIAGNOSIS — E538 Deficiency of other specified B group vitamins: Secondary | ICD-10-CM

## 2023-10-06 DIAGNOSIS — R7302 Impaired glucose tolerance (oral): Secondary | ICD-10-CM

## 2023-10-06 DIAGNOSIS — E531 Pyridoxine deficiency: Secondary | ICD-10-CM

## 2023-10-06 DIAGNOSIS — E519 Thiamine deficiency, unspecified: Secondary | ICD-10-CM | POA: Diagnosis not present

## 2023-10-06 DIAGNOSIS — R208 Other disturbances of skin sensation: Secondary | ICD-10-CM | POA: Insufficient documentation

## 2023-10-06 NOTE — Progress Notes (Signed)
 [facility]    Provider:  Dr Ines Requesting Provider: Sheryle Carwin, MD Primary Care Provider:  Sheryle Carwin, MD  CC:  Sensory changes in the feet  HPI:  Zachary Erickson is a 68 y.o. male here as requested by Sheryle Carwin, MD for paresthesias. has Constipation; Colon cancer screening; and Decreased sensation of foot on their problem list.  I reviewed the limited notes from Dr. Sheryle who states that patient presented for changes in how his legs feel, he denied any pain in his legs, not experiencing weakness numbness tingling or stinging, denied loss of feeling or any back pain, stated he had a pinched nerve once many years ago.  His legs appeared normal, normal pedal pulses, monofilament sensation is normal, gait was normal, could walk on his toes and heels, lower extremity strength intact, diagnosed with paresthesias and vitamin B12 was checked although I do not have the results.  Here with his wife who provides much information. Had prostate cancer years ago and had chemotherapy and did have some fluctuating glucose in that timeframe. Been ongoing for 3-4 years (not coincident with chemo 12-13 years ago) bottom of the feet feeling weird like he is walking on something mashing but in the las 6 months he feels his legs feel funny when he gets up, feels like his feet are swollen when they are not, he actually will get some swelling int he feet, they try to walk every day, both legs feel funny may be either leg the sensations, they can subside when walking or can happen the whole time, he is not in pain, but even just sitting they other him, he feels funny on the bottom of his feet, his sister had a pinched nerve and that affected one leg, brother doesn't have anything, denies numbness, burning, tingling, no pain just weird difficult to explain, not numb just feels funny, not feeling asleep. Not in the hands. Not weakness currently. He has back problems, he has had problems with a  pinched nerve in the past with shooting pain but not now. Doesn't have to stop when walking, he can walk miles. Arms are not involved. He endorses feeling in the feet. Both legs, symmetrical. No significant alcohol use in the past. No ciggarrettes in the past. 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 Worse when walking but feels the strange feelings even when sitting. Staggers sometimes especially if he has been siting, affecting his balance.    Reviewed notes, labs and imaging from outside physicians, which showed:  Review of Systems: Patient complains of symptoms per HPI as well as the following symptoms none. Pertinent negatives and positives per HPI. All others negative.   Social History   Socioeconomic History   Marital status: Married    Spouse name: Not on file   Number of children: Not on file   Years of education: Not on file   Highest education level: Not on file  Occupational History   Occupation: truck driver  Tobacco Use   Smoking status: Never   Smokeless tobacco: Never  Vaping Use   Vaping status: Never Used  Substance and Sexual Activity   Alcohol use: Never   Drug use: Never   Sexual activity: Not on file  Other Topics Concern   Not on file  Social History Narrative   Pt lives with wife    Retired    [employer] investment banker Strain: Not on file  Food Insecurity:  Not on file  Transportation Needs: Not on file  Physical Activity: Not on file  Stress: Not on file  Social Connections: Not on file  Intimate Partner Violence: Not on file    Family History  Problem Relation Age of Onset   Colon cancer Neg Hx    Colon polyps Neg Hx    Neuropathy Neg Hx    Neuromuscular disorder Neg Hx     Past Medical History:  Diagnosis Date   Cancer (HCC)    prostate cancer (2013)   Hypertension     Patient Active Problem List   Diagnosis Date Noted   Decreased sensation of foot 10/06/2023   Constipation 09/28/2019   Colon  cancer screening 09/28/2019    Past Surgical History:  Procedure Laterality Date   COLONOSCOPY WITH PROPOFOL  N/A 02/19/2023   Surgeon: Shaaron Lamar HERO, MD; 4 mm adenomatous polyp.  Recommended 7-year surveillance.   POLYPECTOMY  02/19/2023   Procedure: POLYPECTOMY;  Surgeon: Shaaron Lamar HERO, MD;  Location: [facility];  Service: Endoscopy;;   PROSTATECTOMY      Current Outpatient Medications  Medication Sig Dispense Refill   atorvastatin (LIPITOR) 20 MG tablet Take 20 mg by mouth daily.     linaclotide  (LINZESS ) 145 MCG CAPS capsule Take 1 capsule (145 mcg total) by mouth daily before breakfast. 30 capsule 3   Multiple Vitamin (MULTI-VITAMIN) tablet Take by mouth.     No current facility-administered medications for this visit.    Allergies as of 10/06/2023   (No Known Allergies)    Vitals: BP (!) 158/86   Pulse 79   Ht 5' 9 (1.753 m)   Wt 182 lb (82.6 kg)   BMI 26.88 kg/m  Last Weight:  Wt Readings from Last 1 Encounters:  10/06/23 182 lb (82.6 kg)   Last Height:   Ht Readings from Last 1 Encounters:  10/06/23 5' 9 (1.753 m)     Physical exam: Exam: Gen: NAD, conversant, well nourised, well groomed                     CV: RRR, no MRG. No Carotid Bruits. No peripheral edema, warm, nontender Eyes: Conjunctivae clear without exudates or hemorrhage  Neuro: Detailed Neurologic Exam  Speech:    Speech is normal; fluent and spontaneous with normal comprehension.  Cognition:    The patient is oriented to person, place, and time;     recent and remote memory intact;     language fluent;     normal attention, concentration,     fund of knowledge Cranial Nerves:    The pupils are equal, round, and reactive to light.  Attempted, pupils are too small to visualize fundi visual fields are full to finger confrontation. Extraocular movements are intact. Trigeminal sensation is intact and the muscles of mastication are normal. The face is symmetric. The palate elevates  in the midline. Hearing intact. Voice is normal. Shoulder shrug is normal. The tongue has normal motion without fasciculations.   Coordination: nml  Gait:    Heel-toe gait are normal.  Difficulty.  Motor Observation:    No asymmetry, no atrophy, and no involuntary movements noted. Tone:    Normal muscle tone.    Posture:    Posture is normal. normal erect    Strength: Mild hip flexion weakness 4+/5 symmetrical otherwise strength is V/V in the upper and lower limbs.      Sensation: intact to LT, ntact to pin prick, decrease to cold  and vibration distally in the feet,      Reflex Exam:  DTR's:    Deep tendon reflexes in the upper and lower extremities are symmetrical bilaterally.  Slightly brisk in the lowers.  Toes:    The toes are downgoing bilaterally.   Clonus:    Clonus is absent. Hoffmann's negative  Assessment/Plan: This is a lovely 68 year old patient is here with his wife who also provides information.  He cannot really describe what he is feeling in his feet and legs, states when he walks it feels as though the bottom of his feet feel like they are swollen, not necessarily numbness or paresthesias but just a strange feeling he cannot describe and may be some imbalance, ongoing 3 to 4 years and possibly worsening in the last 6 months.  No significant low back pain or symptoms or signs of lumbar or cervical stenosis.  No pain.  No radicular symptoms.  No current weakness.  Arms are not involved.  Legs are symmetrically affected.  Had chemotherapy in the past about 12 years ago for prostate cancer but was not coincident with the symptoms.  Unclear etiology, his exam did show some decreased temperature and vibration distally in the great toes which may be age-related, he does not have any risk factors for peripheral neuropathy however we will perform some serum testing for common causes and ordering EMG nerve conduction study.  Orders Placed This Encounter  Procedures    Hemoglobin A1c   B12 and Folate Panel   Methylmalonic acid, serum   TSH   Vitamin B6   Multiple Myeloma Panel (SPEP&IFE w/QIG)   Vitamin B1   NCV with EMG(electromyography)    Cc: Sheryle Carwin, MD,  Sheryle Carwin, MD  Onetha Epp, MD  [facility] [address] [place], [place] [address]  Phone [phone] Fax [phone]  I spent 70 minutes of face-to-face and non-face-to-face time with patient on the  1. Decreased sensation of foot   2. screen for B12 deficiency   3. screen for Vitamin B1 deficiency   4. screen for Vitamin B6 deficiency   5. screen for Glucose intolerance (impaired glucose tolerance)    diagnosis.  This included previsit chart review, lab review, study review, order entry, electronic health record documentation, patient education on the different diagnostic and therapeutic options, counseling and coordination of care, risks and benefits of management, compliance, or risk factor reduction

## 2023-10-06 NOTE — Patient Instructions (Addendum)
 Peripheral Neuropathy Peripheral neuropathy is a type of nerve damage. It affects nerves that carry signals between the spinal cord and the arms, legs, and the rest of the body (peripheral nerves). It does not affect nerves in the spinal cord or brain. In peripheral neuropathy, one nerve or a group of nerves may be damaged. Peripheral neuropathy is a broad category that includes many specific nerve disorders, like diabetic neuropathy, hereditary neuropathy, and carpal tunnel syndrome. What are the causes? This condition may be caused by: Certain diseases, such as: Diabetes. This is the most common cause of peripheral neuropathy. Autoimmune diseases, such as rheumatoid arthritis and systemic lupus erythematosus. Nerve diseases that are passed from parent to child (inherited). Kidney disease. Thyroid disease. Other causes may include: Nerve injury. Pressure or stress on a nerve that lasts a long time. Lack (deficiency) of B vitamins. This can result from alcoholism, poor diet, or a restricted diet. Infections. Some medicines, such as cancer medicines (chemotherapy). Poisonous (toxic) substances, such as lead and mercury. Too little blood flowing to the legs. In some cases, the cause of this condition is not known. What are the signs or symptoms? Symptoms of this condition depend on which of your nerves is damaged. Symptoms in the legs, hands, and arms can include: Loss of feeling (numbness) in the feet, hands, or both. Tingling in the feet, hands, or both. Burning pain. Very sensitive skin. Weakness. Not being able to move a part of the body (paralysis). Clumsiness or poor coordination. Muscle twitching. Loss of balance. Symptoms in other parts of the body can include: Not being able to control your bladder. Feeling dizzy. Sexual problems. How is this diagnosed? Diagnosing and finding the cause of peripheral neuropathy can be difficult. Your health care provider will take your  medical history and do a physical exam. A neurological exam will also be done. This involves checking things that are affected by your brain, spinal cord, and nerves (nervous system). For example, your health care provider will check your reflexes, how you move, and what you can feel. You may have other tests, such as: Blood tests. Electromyogram (EMG) and nerve conduction tests. These tests check nerve function and how well the nerves are controlling the muscles. Imaging tests, such as a CT scan or MRI, to rule out other causes of your symptoms. Removing a small piece of nerve to be examined in a lab (nerve biopsy). Removing and examining a small amount of the fluid that surrounds the brain and spinal cord (lumbar puncture). How is this treated? Treatment for this condition may involve: Treating the underlying cause of the neuropathy, such as diabetes, kidney disease, or vitamin deficiencies. Stopping medicines that can cause neuropathy, such as chemotherapy. Medicine to help relieve pain. Medicines may include: Prescription or over-the-counter pain medicine. Anti-seizure medicine. Antidepressants. Pain-relieving patches that are applied to painful areas of skin. Surgery to relieve pressure on a nerve or to destroy a nerve that is causing pain. Physical therapy to help improve movement and balance. Devices to help you move around (assistive devices). Follow these instructions at home: Medicines Take over-the-counter and prescription medicines only as told by your health care provider. Do not take any other medicines without first asking your health care provider. Ask your health care provider if the medicine prescribed to you requires you to avoid driving or using machinery. Lifestyle  Do not use any products that contain nicotine or tobacco. These products include cigarettes, chewing tobacco, and vaping devices, such as e-cigarettes. Smoking keeps  blood from reaching damaged nerves. If you  need help quitting, ask your health care provider. Avoid or limit alcohol. Too much alcohol can cause a vitamin B deficiency, and vitamin B is needed for healthy nerves. Eat a healthy diet. This includes: Eating foods that are high in fiber, such as beans, whole grains, and fresh fruits and vegetables. Limiting foods that are high in fat and processed sugars, such as fried or sweet foods. General instructions  If you have diabetes, work closely with your health care provider to keep your blood sugar under control. If you have numbness in your feet: Check every day for signs of injury or infection. Watch for redness, warmth, and swelling. Wear padded socks and comfortable shoes. These help protect your feet. Develop a good support system. Living with peripheral neuropathy can be stressful. Consider talking with a mental health specialist or joining a support group. Use assistive devices and attend physical therapy as told by your health care provider. This may include using a walker or a cane. Keep all follow-up visits. This is important. Where to find more information [employer] of Neurological Disorders: [employer] Contact a health care provider if: You have new signs or symptoms of peripheral neuropathy. You are struggling emotionally from dealing with peripheral neuropathy. Your pain is not well controlled. Get help right away if: You have an injury or infection that is not healing normally. You develop new weakness in an arm or leg. You have fallen or do so frequently. Summary Peripheral neuropathy is when the nerves in the arms or legs are damaged, resulting in numbness, weakness, or pain. There are many causes of peripheral neuropathy, including diabetes, pinched nerves, vitamin deficiencies, autoimmune disease, and hereditary conditions. Diagnosing and finding the cause of peripheral neuropathy can be difficult. Your health care provider will take your medical  history, do a physical exam, and do tests, including blood tests and nerve function tests. Treatment involves treating the underlying cause of the neuropathy and taking medicines to help control pain. Physical therapy and assistive devices may also help. This information is not intended to replace advice given to you by your health care provider. Make sure you discuss any questions you have with your health care provider.  Electromyoneurogram Electromyoneurogram is a test to check how well your muscles and nerves are working. This procedure includes the combined use of electromyogram (EMG) and nerve conduction study (NCS). EMG is used to evaluate muscles and the nerves that control those muscles. NCS, which is also called electroneurogram, measures how well your nerves conduct electricity. The procedures should be done together to check if your muscles and nerves are healthy. If the results of the tests are abnormal, this may indicate disease or injury, such as a neuromuscular disease or peripheral nerve damage. Tell a health care provider about: Any allergies you have. All medicines you are taking, including vitamins, herbs, eye drops, creams, and over-the-counter medicines. Any bleeding problems you have. Any surgeries you have had. Any medical conditions you have. What are the risks? Generally, this is a safe procedure. However, problems may occur, including: Bleeding or bruising. Infection where the electrodes were inserted. What happens before the test? Medicines Take all of your usually prescribed medications before this testing is performed. Do not stop your blood thinners unless advised by your prescribing physician. General instructions Your health care provider may ask you to warm the limb that will be checked with warm water , hot pack, or wrapping the limb in a blanket. Do  not use lotions or creams on the same day that you will be having the procedure. What happens during the  test? For EMG  Your health care provider will ask you to stay in a position so that the muscle being studied can be accessed. You will be sitting or lying down. You may be given a medicine to numb the area (local anesthetic) and the skin will be disinfected. A very thin needle that has an electrode will be inserted into your muscle, one muscle at a time. Typically, multiple muscles are evaluated during a single study. Another small electrode will be placed on your skin near the muscle. Your health care provider will ask you to continue to remain still. The electrodes will record the electrical activity of your muscles. You may see this on a monitor or hear it in the room. After your muscles have been studied at rest, your health care provider will ask you to contract or flex your muscles. The electrodes will record the electrical activity of your muscles. Your health care provider will remove the electrodes and the electrode needle when the procedure is finished. The procedure may vary among health care providers and hospitals. For NCS  An electrode that records your nerve activity (recording electrode) will be placed on your skin by the muscle that is being studied. An electrode that is used as a reference (reference electrode) will be placed near the recording electrode. A paste or gel will be applied to your skin between the recording electrode and the reference electrode. Your nerve will be stimulated with a mild shock. The speed of the nerves and strength of response is recorded by the electrodes. Your health care provider will remove the electrodes and the gel when the procedure is finished. The procedure may vary among health care providers and hospitals. What can I expect after the test? It is up to you to get your test results. Ask your health care provider, or the department that is doing the test, when your results will be ready. Your health care provider may: Give you medicines for  any pain. Monitor the insertion sites to make sure that bleeding stops. You should be able to drive yourself to and from the test. Discomfort can persist for a few hours after the test, but should be better the next day. Contact a health care provider if: You have swelling, redness, or drainage at any of the insertion sites. Summary Electromyoneurogram is a test to check how well your muscles and nerves are working. If the results of the tests are abnormal, this may indicate disease or injury. This is a safe procedure. However, problems may occur, such as bleeding and infection. Your health care provider will do two tests to complete this procedure. One checks your muscles (EMG) and another checks your nerves (NCS). It is up to you to get your test results. Ask your health care provider, or the department that is doing the test, when your results will be ready. This information is not intended to replace advice given to you by your health care provider. Make sure you discuss any questions you have with your health care provider. Document Revised: 12/12/2020 Document Reviewed: 11/11/2020 Elsevier Patient Education  2024 [employer]vier Inc. Document Revised: 12/04/2020 Document Reviewed: 12/04/2020 Elsevier Patient Education  2024 [employer].

## 2023-10-10 LAB — VITAMIN B6: Vitamin B6: 25.1 ug/L (ref 3.4–65.2)

## 2023-10-10 LAB — HEMOGLOBIN A1C
Est. average glucose Bld gHb Est-mCnc: 120 mg/dL
Hgb A1c MFr Bld: 5.8 % — ABNORMAL HIGH (ref 4.8–5.6)

## 2023-10-10 LAB — MULTIPLE MYELOMA PANEL, SERUM
Albumin SerPl Elph-Mcnc: 3.9 g/dL (ref 2.9–4.4)
Albumin/Glob SerPl: 1.2 (ref 0.7–1.7)
Alpha 1: 0.2 g/dL (ref 0.0–0.4)
Alpha2 Glob SerPl Elph-Mcnc: 0.7 g/dL (ref 0.4–1.0)
B-Globulin SerPl Elph-Mcnc: 1.1 g/dL (ref 0.7–1.3)
Gamma Glob SerPl Elph-Mcnc: 1.4 g/dL (ref 0.4–1.8)
Globulin, Total: 3.5 g/dL (ref 2.2–3.9)
IgA/Immunoglobulin A, Serum: 305 mg/dL (ref 61–437)
IgG (Immunoglobin G), Serum: 1380 mg/dL (ref 603–1613)
IgM (Immunoglobulin M), Srm: 70 mg/dL (ref 20–172)
Total Protein: 7.4 g/dL (ref 6.0–8.5)

## 2023-10-10 LAB — TSH: TSH: 1.6 u[IU]/mL (ref 0.450–4.500)

## 2023-10-10 LAB — B12 AND FOLATE PANEL
Folate: 15.1 ng/mL (ref >3.0)
Vitamin B-12: 519 pg/mL (ref 232–1245)

## 2023-10-10 LAB — VITAMIN B1: Thiamine: 143.1 nmol/L (ref 66.5–200.0)

## 2023-10-10 LAB — METHYLMALONIC ACID, SERUM: Methylmalonic Acid: 240 nmol/L (ref 0–378)

## 2023-10-13 ENCOUNTER — Ambulatory Visit: Payer: Self-pay | Admitting: Neurology

## 2023-10-15 ENCOUNTER — Telehealth: Payer: Self-pay | Admitting: Neurology

## 2023-10-15 NOTE — Telephone Encounter (Signed)
 Sent mychart msg and updated appt reminder in mail informing pt of appt change - provider schedule change

## 2023-12-07 NOTE — Progress Notes (Unsigned)
 Referring Provider: Sheryle Carwin, MD Primary Care Physician:  Sheryle Carwin, MD Primary GI Physician: Dr. Shaaron  No chief complaint on file.   HPI:   Zachary Erickson is a 68 y.o. male with history of prostate cancer, HTN, constipation, adenomatous colon polyp, presenting today for follow-up of constipation.   Last seen in the office 06/10/23.  Reported he had been doing well with Linzess  145 mcg, but he was no longer able to afford this until he met his deductible.  He was controlling his constipation with diet. He was advised to continue dietary measures and add benefiber. Could resume Linzess  if needed once deductible had been met.   Today:    He has taken Dulcolax, some other over-the-counter laxatives in the past.  Did not feel that MiraLAX worked very well previously. ***   Colonoscopy 02/19/23: One 4 mm polyp in the mid sigmoid colon resected and retried. Pathology with tubular adenoma. Recommended repeat colonoscopy in 7 years.   Past Medical History:  Diagnosis Date   Cancer [facility])    prostate cancer (2013)   Hypertension     Past Surgical History:  Procedure Laterality Date   COLONOSCOPY WITH PROPOFOL  N/A 02/19/2023   Surgeon: Shaaron Lamar HERO, MD; 4 mm adenomatous polyp.  Recommended 7-year surveillance.   POLYPECTOMY  02/19/2023   Procedure: POLYPECTOMY;  Surgeon: Shaaron Lamar HERO, MD;  Location: [facility];  Service: Endoscopy;;   PROSTATECTOMY      Current Outpatient Medications  Medication Sig Dispense Refill   atorvastatin (LIPITOR) 20 MG tablet Take 20 mg by mouth daily.     linaclotide  (LINZESS ) 145 MCG CAPS capsule Take 1 capsule (145 mcg total) by mouth daily before breakfast. 30 capsule 3   Multiple Vitamin (MULTI-VITAMIN) tablet Take by mouth.     No current facility-administered medications for this visit.    Allergies as of 12/09/2023   (No Known Allergies)    Family History  Problem Relation Age of Onset   Colon cancer Neg Hx    Colon polyps  Neg Hx    Neuropathy Neg Hx    Neuromuscular disorder Neg Hx     Social History   Socioeconomic History   Marital status: Married    Spouse name: Not on file   Number of children: Not on file   Years of education: Not on file   Highest education level: Not on file  Occupational History   Occupation: truck driver  Tobacco Use   Smoking status: Never   Smokeless tobacco: Never  Vaping Use   Vaping status: Never Used  Substance and Sexual Activity   Alcohol use: Never   Drug use: Never   Sexual activity: Not on file  Other Topics Concern   Not on file  Social History Narrative   Pt lives with wife    Retired    [employer] Strain: Not on file  Food Insecurity: Not on file  Transportation Needs: Not on file  Physical Activity: Not on file  Stress: Not on file  Social Connections: Not on file    Review of Systems: Gen: Denies fever, chills, anorexia. Denies fatigue, weakness, weight loss.  CV: Denies chest pain, palpitations, syncope, peripheral edema, and claudication. Resp: Denies dyspnea at rest, cough, wheezing, coughing up blood, and pleurisy. GI: Denies vomiting blood, jaundice, and fecal incontinence.   Denies dysphagia or odynophagia. Derm: Denies rash, itching, dry skin Psych: Denies depression, anxiety, memory loss,  confusion. No homicidal or suicidal ideation.  Heme: Denies bruising, bleeding, and enlarged lymph nodes.  Physical Exam: There were no vitals taken for this visit. General:   Alert and oriented. No distress noted. Pleasant and cooperative.  Head:  Normocephalic and atraumatic. Eyes:  Conjuctiva clear without scleral icterus. Heart:  S1, S2 present without murmurs appreciated. Lungs:  Clear to auscultation bilaterally. No wheezes, rales, or rhonchi. No distress.  Abdomen:  +BS, soft, non-tender and non-distended. No rebound or guarding. No HSM or masses noted. Msk:  Symmetrical without gross deformities.  Normal posture. Extremities:  Without edema. Neurologic:  Alert and  oriented x4 Psych:  Normal mood and affect.    Assessment:     Plan:  ***   Josette Centers, PA-C [facility] 12/09/2023

## 2023-12-09 ENCOUNTER — Encounter: Payer: Self-pay | Admitting: Gastroenterology

## 2023-12-09 ENCOUNTER — Ambulatory Visit: Payer: Medicare Other | Admitting: Gastroenterology

## 2023-12-09 VITALS — BP 136/74 | HR 78 | Temp 97.8°F | Ht 68.0 in | Wt 179.6 lb

## 2023-12-09 DIAGNOSIS — K5904 Chronic idiopathic constipation: Secondary | ICD-10-CM

## 2023-12-09 MED ORDER — LUBIPROSTONE 8 MCG PO CAPS: 8.0000 ug | ORAL_CAPSULE | Freq: Two times a day (BID) | ORAL | 5 refills | Status: DC

## 2023-12-09 NOTE — Patient Instructions (Signed)
 Start Amitiza  8 mcg twice daily with a meal for constipation.   Drink at least 64 oz of water  daily.  Consume plenty of fruits, vegetables, and whole grains to maintain adequate fiber intake.   We will see you back in 6 months or sooner if needed.   Josette Centers, PA-C [facility]

## 2023-12-09 NOTE — Progress Notes (Signed)
 Full Name: Zachary Erickson Gender: Male MRN #: [id number] Date of Birth: July 07, 1955    Visit Date: 12/10/2023 09:13 Age: 68 Years  History: This is a lovely 68 year old patient is here with his wife who also provides information.  He cannot really describe what he is feeling in his feet and legs, states when he walks it feels as though the bottom of his feet feel like they are swollen, not necessarily numbness or paresthesias but just a strange feeling he cannot describe and may be some imbalance, ongoing 3 to 4 years and possibly worsening in the last 6 months.  No significant low back pain or symptoms or signs of lumbar or cervical stenosis.  No pain.  No radicular symptoms.  No current weakness.  Arms are not involved.  Legs are symmetrically affected.  Had chemotherapy in the past about 12 years ago for prostate cancer but was not coincident with the symptoms.  Summary: EMG/NCS performed on the right lower extremity. All nerves and muscles (as indicated in the following tables) were within normal limits.      Conclusion: This is a normal study of the right lower extremity. No electrophysiologic evidence for peripheral polyneuropathy or lumbar radiculopathy.  Patient feels improved, will monitor clinically. Cannot rule out a small-fiber neuropathy.     ------------------------------- Onetha Epp, M.D.  [facility] [address] [place], [place] [address] Tel: [phone] Fax: [phone]  Verbal informed consent was obtained from the patient, patient was informed of potential risk of procedure, including bruising, bleeding, hematoma formation, infection, muscle weakness, muscle pain, numbness, among others.        MNC    Nerve / Sites Muscle Latency Ref. Amplitude Ref. Rel Amp Segments Distance Velocity Ref. Area    ms ms mV mV %  cm m/s m/s mVms  R Peroneal - EDB     Ankle EDB 3.6 <=6.5 5.7 >=2.0 100 Ankle - EDB 5   18.6     Fib head EDB 11.2   4.7  81.5 Fib head - Ankle 34 45 >=44 18.5     Pop fossa EDB 13.8  4.4  94.9 Pop fossa - Fib head 11 44 >=44 18.2         Pop fossa - Ankle      R Tibial - AH     Ankle AH 5.1 <=5.8 7.9 >=4.0 100 Ankle - AH 9   24.0     Pop fossa AH 14.2  7.0  87.8 Pop fossa - Ankle 40 44 >=41 31.3         SNC    Nerve / Sites Rec. Site Peak Lat Ref.  Amp Ref. Segments Distance    ms ms V V  cm  R Sural - Ankle (Calf) (1)     Calf Ankle 4.1 <=4.4 15 >=6 Calf - Ankle 14  R Superficial peroneal - Ankle     Lat leg Ankle 3.5 <=4.4 6 >=6 Lat leg - Ankle 14         F  Wave    Nerve F Lat Ref.   ms ms  R Peroneal - EDB 54.3 <=56.0       EMG Summary Table    Spontaneous MUAP Recruitment  Muscle IA Fib PSW Fasc Other Amp Dur. Poly Pattern  R. Vastus medialis Normal None None None _______ Normal Normal Normal Normal  R. Tibialis anterior Normal None None None _______ Normal Normal Normal  Normal  R. Gastrocnemius (Medial head) Normal None None None _______ Normal Normal Normal Normal  R. Extensor hallucis longus Normal None None None _______ Normal Normal Normal Normal  R. Abductor hallucis Normal None None None _______ Normal Normal Normal Normal

## 2023-12-10 ENCOUNTER — Ambulatory Visit: Admitting: Neurology

## 2023-12-10 VITALS — BP 150/82 | HR 74

## 2023-12-10 DIAGNOSIS — G6289 Other specified polyneuropathies: Secondary | ICD-10-CM

## 2023-12-14 NOTE — Procedures (Signed)
 Full Name: Zachary Erickson Gender: Male MRN #: [id number] Date of Birth: July 07, 1955    Visit Date: 12/10/2023 09:13 Age: 68 Years  History: This is a lovely 68 year old patient is here with his wife who also provides information.  He cannot really describe what he is feeling in his feet and legs, states when he walks it feels as though the bottom of his feet feel like they are swollen, not necessarily numbness or paresthesias but just a strange feeling he cannot describe and may be some imbalance, ongoing 3 to 4 years and possibly worsening in the last 6 months.  No significant low back pain or symptoms or signs of lumbar or cervical stenosis.  No pain.  No radicular symptoms.  No current weakness.  Arms are not involved.  Legs are symmetrically affected.  Had chemotherapy in the past about 12 years ago for prostate cancer but was not coincident with the symptoms.  Summary: EMG/NCS performed on the right lower extremity. All nerves and muscles (as indicated in the following tables) were within normal limits.      Conclusion: This is a normal study of the right lower extremity. No electrophysiologic evidence for peripheral polyneuropathy or lumbar radiculopathy.  Patient feels improved, will monitor clinically. Cannot rule out a small-fiber neuropathy.     ------------------------------- Onetha Epp, M.D.  [facility] [address] [place], [place] [address] Tel: [phone] Fax: [phone]  Verbal informed consent was obtained from the patient, patient was informed of potential risk of procedure, including bruising, bleeding, hematoma formation, infection, muscle weakness, muscle pain, numbness, among others.        MNC    Nerve / Sites Muscle Latency Ref. Amplitude Ref. Rel Amp Segments Distance Velocity Ref. Area    ms ms mV mV %  cm m/s m/s mVms  R Peroneal - EDB     Ankle EDB 3.6 <=6.5 5.7 >=2.0 100 Ankle - EDB 5   18.6     Fib head EDB 11.2   4.7  81.5 Fib head - Ankle 34 45 >=44 18.5     Pop fossa EDB 13.8  4.4  94.9 Pop fossa - Fib head 11 44 >=44 18.2         Pop fossa - Ankle      R Tibial - AH     Ankle AH 5.1 <=5.8 7.9 >=4.0 100 Ankle - AH 9   24.0     Pop fossa AH 14.2  7.0  87.8 Pop fossa - Ankle 40 44 >=41 31.3         SNC    Nerve / Sites Rec. Site Peak Lat Ref.  Amp Ref. Segments Distance    ms ms V V  cm  R Sural - Ankle (Calf) (1)     Calf Ankle 4.1 <=4.4 15 >=6 Calf - Ankle 14  R Superficial peroneal - Ankle     Lat leg Ankle 3.5 <=4.4 6 >=6 Lat leg - Ankle 14         F  Wave    Nerve F Lat Ref.   ms ms  R Peroneal - EDB 54.3 <=56.0       EMG Summary Table    Spontaneous MUAP Recruitment  Muscle IA Fib PSW Fasc Other Amp Dur. Poly Pattern  R. Vastus medialis Normal None None None _______ Normal Normal Normal Normal  R. Tibialis anterior Normal None None None _______ Normal Normal Normal  Normal  R. Gastrocnemius (Medial head) Normal None None None _______ Normal Normal Normal Normal  R. Extensor hallucis longus Normal None None None _______ Normal Normal Normal Normal  R. Abductor hallucis Normal None None None _______ Normal Normal Normal Normal

## 2023-12-14 NOTE — Progress Notes (Signed)
 History: This is a lovely 68 year old patient is here with his wife who also provides information.  He cannot really describe what he is feeling in his feet and legs, states when he walks it feels as though the bottom of his feet feel like they are swollen, not necessarily numbness or paresthesias but just a strange feeling he cannot describe and may be some imbalance, ongoing 3 to 4 years and possibly worsening in the last 6 months.  No significant low back pain or symptoms or signs of lumbar or cervical stenosis.  No pain.  No radicular symptoms.  No current weakness.  Arms are not involved.  Legs are symmetrically affected.  Had chemotherapy in the past about 12 years ago for prostate cancer but was not coincident with the symptoms.   Unclear etiology, his exam did show some decreased temperature and vibration distally in the great toes which may be age-related, he does not have any risk factors for peripheral neuropathy and serum testing was unremarkable as below, hgba1c 5.8 technically pre-diabetes and advised management, b12 nml, mma nml, tsh nml, b6 nml, b1 nml, ife/spep without cause. Reviewed above with patient and wife. Can f/u in 6 months, cannot rule out small-fiber neuropathy, consider skin biopsy in the future.   Recent Results (from the past 2160 hours)  Hemoglobin A1c     Status: Abnormal   Collection Time: 10/06/23 11:28 AM  Result Value Ref Range   Hgb A1c MFr Bld 5.8 (H) 4.8 - 5.6 %    Comment:          Prediabetes: 5.7 - 6.4          Diabetes: >6.4          Glycemic control for adults with diabetes: <7.0    Est. average glucose Bld gHb Est-mCnc 120 mg/dL  A87 and Folate Panel     Status: None   Collection Time: 10/06/23 11:28 AM  Result Value Ref Range   Vitamin B-12 519 232 - 1,245 pg/mL   Folate 15.1 >3.0 ng/mL    Comment: A serum folate concentration of less than 3.1 ng/mL is considered to represent clinical deficiency.   Methylmalonic acid, serum     Status: None    Collection Time: 10/06/23 11:28 AM  Result Value Ref Range   Methylmalonic Acid 240 0 - 378 nmol/L  TSH     Status: None   Collection Time: 10/06/23 11:28 AM  Result Value Ref Range   TSH 1.600 0.450 - 4.500 uIU/mL  Vitamin B6     Status: None   Collection Time: 10/06/23 11:28 AM  Result Value Ref Range   Vitamin B6 25.1 3.4 - 65.2 ug/L    Comment:                              Deficiency:         <3.4                              Marginal:      3.4 - 5.1                              Adequate:           >5.1   Multiple Myeloma Panel (SPEP&IFE w/QIG)     Status: None   Collection Time:  10/06/23 11:28 AM  Result Value Ref Range   IgG (Immunoglobin G), Serum 1,380 603 - 1,613 mg/dL   IgA/Immunoglobulin A, Serum 305 61 - 437 mg/dL   IgM (Immunoglobulin M), Srm 70 20 - 172 mg/dL   Total Protein 7.4 6.0 - 8.5 g/dL   Albumin SerPl Elph-Mcnc 3.9 2.9 - 4.4 g/dL   Alpha 1 0.2 0.0 - 0.4 g/dL   Alpha2 Glob SerPl Elph-Mcnc 0.7 0.4 - 1.0 g/dL   B-Globulin SerPl Elph-Mcnc 1.1 0.7 - 1.3 g/dL   Gamma Glob SerPl Elph-Mcnc 1.4 0.4 - 1.8 g/dL   M Protein SerPl Elph-Mcnc Not Observed Not Observed g/dL   Globulin, Total 3.5 2.2 - 3.9 g/dL   Albumin/Glob SerPl 1.2 0.7 - 1.7   IFE 1 Comment     Comment: The immunofixation pattern appears unremarkable. Evidence of monoclonal protein is not apparent.    Please Note Comment     Comment: Protein electrophoresis scan will follow via computer, mail, or courier delivery.   Vitamin B1     Status: None   Collection Time: 10/06/23 11:28 AM  Result Value Ref Range   Thiamine 143.1 66.5 - 200.0 nmol/L     F/u 6 monts Future considerations. Includes skin biopsy. Cannot rule out a small fiber neuropathy.   I spent 12 minutes of face-to-face and non-face-to-face time with patient on the No diagnosis found. diagnosis.  This included previsit chart review, lab review, study review, order entry, electronic health record documentation, patient education on the  different diagnostic and therapeutic options, counseling and coordination of care, risks and benefits of management, compliance, or risk factor reduction. This does not include time spent on emg/ncs.

## 2024-01-21 ENCOUNTER — Telehealth: Payer: Self-pay

## 2024-01-21 NOTE — Telephone Encounter (Signed)
 Called and left voicemail for patient to reschedule appointment on 06/15/24 with Dr Ines.  If patient calls back, they can be rescheduled with Dr Onita

## 2024-02-02 ENCOUNTER — Telehealth: Payer: Self-pay | Admitting: Neurology

## 2024-02-02 NOTE — Telephone Encounter (Signed)
 Pt called to request earlier appt the patient states he is having weakness in his legs. Pt infom me  when Dr, Ines did Nerve Conduction study  , She said Pt can follow up if he was having any problems .

## 2024-03-15 ENCOUNTER — Telehealth: Payer: Self-pay | Admitting: Neurology

## 2024-03-15 ENCOUNTER — Encounter: Payer: Self-pay | Admitting: Neurology

## 2024-03-15 ENCOUNTER — Ambulatory Visit: Admitting: Neurology

## 2024-03-15 VITALS — BP 150/83 | HR 66 | Ht 69.0 in | Wt 180.2 lb

## 2024-03-15 DIAGNOSIS — R269 Unspecified abnormalities of gait and mobility: Secondary | ICD-10-CM | POA: Insufficient documentation

## 2024-03-15 DIAGNOSIS — M51369 Other intervertebral disc degeneration, lumbar region without mention of lumbar back pain or lower extremity pain: Secondary | ICD-10-CM | POA: Diagnosis not present

## 2024-03-15 NOTE — Telephone Encounter (Signed)
 no auth required sent to GI [phone]

## 2024-03-15 NOTE — Progress Notes (Signed)
 Chief Complaint  Patient presents with   Follow-up    Pt in EMG room 3. Wife in room.Here for paresthesias.      ASSESSMENT AND PLAN  Zachary Erickson is a 68 y.o. male  Slow Worsening gait abnormality, lower extremity discomfort,  Hyperreflexia on examination,  Most worrisome for cervical spondylitic myelopathy, with superimposed lumbar radiculopathy  MRI of cervical and lumbar spine   DIAGNOSTIC DATA (LABS, IMAGING, TESTING) - I reviewed patient records, labs, notes, testing and imaging myself where available.   MEDICAL HISTORY:  Zachary Erickson is a 68 year old male accompanied by his wife, seen in request by   his primary care from [place], Dr. Sheryle Carwin, for evaluation of slow worsening gait abnormality  History is obtained from the patient and review of electronic medical records. I personally reviewed pertinent available imaging films in PACS.   PMHx of  HTN HLD Prostate Cancer in 2013, chemotherapy and prostatectomy  Over past couple years, he had intermittent neck discomfort, if he is stepping on brake pedal, he felt awkward sensation throughout his right leg, he used to sleep on his stomach, now if he sleeps on his stomach, hyperextended his neck, he felt lower extremity spastic funny sensation  Wife also noticed mild gait change, is hard for him to keep up his daily walking routine, legs feel stiff,   He already had a EMG nerve conduction study by Dr. Ines in June 2025 that was essentially normal  He has chronic constipation, mild urinary urgency due to previous prostate issues  Reviewed CT abdomen in June 2021, no acute intracranial abnormality, sagittal and axial view of lumbar spine showed multilevel degenerative changes   PHYSICAL EXAM:   Vitals:   03/15/24 1125  BP: (!) 150/83  Pulse: 66  SpO2: 98%  Weight: 180 lb 3.2 oz (81.7 kg)  Height: 5' 9 (1.753 m)     Body mass index is 26.61 kg/m.  PHYSICAL EXAMNIATION:  Gen: NAD, conversant,  well nourised, well groomed                     Cardiovascular: Regular rate rhythm, no peripheral edema, warm, nontender. Eyes: Conjunctivae clear without exudates or hemorrhage Neck: Supple, no carotid bruits. Pulmonary: Clear to auscultation bilaterally   NEUROLOGICAL EXAM:  MENTAL STATUS: Speech/cognition: Awake, alert, oriented to history taking and casual conversation CRANIAL NERVES: CN II: Visual fields are full to confrontation. Pupils are round equal and briskly reactive to light. CN III, IV, VI: extraocular movement are normal. No ptosis. CN V: Facial sensation is intact to light touch CN VII: Face is symmetric with normal eye closure  CN VIII: Hearing is normal to causal conversation. CN IX, X: Phonation is normal. CN XI: Head turning and shoulder shrug are intact  MOTOR: Mild symmetric bilateral hip flexion weakness  REFLEXES: Reflexes are 2+ and symmetric at the biceps, triceps, knees, and ankles. Plantar responses are extensor bilaterally  SENSORY: Intact to light touch, pinprick and vibratory sensation are intact in fingers and toes.  COORDINATION: There is no trunk or limb dysmetria noted.  GAIT/STANCE: Push-up from seated position, stiff  REVIEW OF SYSTEMS:  Full 14 system review of systems performed and notable only for as above All other review of systems were negative.   ALLERGIES: No Known Allergies  HOME MEDICATIONS: Current Outpatient Medications  Medication Sig Dispense Refill   Multiple Vitamin (MULTI-VITAMIN) tablet Take by mouth.     olmesartan (BENICAR) 20 MG tablet Take  20 mg by mouth daily.     rosuvastatin (CRESTOR) 10 MG tablet SMARTSIG:1 Tablet(s) By Mouth Every Evening     atorvastatin (LIPITOR) 20 MG tablet Take 20 mg by mouth daily. (Patient not taking: Reported on 03/15/2024)     lubiprostone  (AMITIZA ) 8 MCG capsule Take 1 capsule (8 mcg total) by mouth 2 (two) times daily with a meal. (Patient not taking: Reported on 03/15/2024) 60  capsule 5   No current facility-administered medications for this visit.    PAST MEDICAL HISTORY: Past Medical History:  Diagnosis Date   Cancer [facility])    prostate cancer (2013)   Constipation    Hypertension     PAST SURGICAL HISTORY: Past Surgical History:  Procedure Laterality Date   COLONOSCOPY WITH PROPOFOL  N/A 02/19/2023   Surgeon: Shaaron Lamar HERO, MD; 4 mm adenomatous polyp.  Recommended 7-year surveillance.   POLYPECTOMY  02/19/2023   Procedure: POLYPECTOMY;  Surgeon: Shaaron Lamar HERO, MD;  Location: [facility];  Service: Endoscopy;;   PROSTATECTOMY      FAMILY HISTORY: Family History  Problem Relation Age of Onset   Colon cancer Neg Hx    Colon polyps Neg Hx    Neuropathy Neg Hx    Neuromuscular disorder Neg Hx     SOCIAL HISTORY: Social History   Socioeconomic History   Marital status: Married    Spouse name: Not on file   Number of children: Not on file   Years of education: Not on file   Highest education level: Not on file  Occupational History   Occupation: truck driver  Tobacco Use   Smoking status: Never   Smokeless tobacco: Never  Vaping Use   Vaping status: Never Used  Substance and Sexual Activity   Alcohol use: Never   Drug use: Never   Sexual activity: Not on file  Other Topics Concern   Not on file  Social History Narrative   Pt lives with wife    Retired    [employer] Strain: Not on file  Food Insecurity: Not on file  Transportation Needs: Not on file  Physical Activity: Not on file  Stress: Not on file  Social Connections: Not on file  Intimate Partner Violence: Not on file      Modena Callander, M.D. Ph.D.  [facility] [address], [address] [place], [place] [address] Ph: [phone] Fax: [phone]  CC:  Sheryle Carwin, MD [address] [place],  [place] [address]  Sheryle Carwin, MD

## 2024-04-03 ENCOUNTER — Inpatient Hospital Stay: Admission: RE | Admit: 2024-04-03 | Discharge: 2024-04-03 | Attending: Neurology | Admitting: Neurology

## 2024-04-03 ENCOUNTER — Telehealth: Payer: Self-pay | Admitting: Neurology

## 2024-04-03 DIAGNOSIS — R269 Unspecified abnormalities of gait and mobility: Secondary | ICD-10-CM

## 2024-04-03 DIAGNOSIS — M51369 Other intervertebral disc degeneration, lumbar region without mention of lumbar back pain or lower extremity pain: Secondary | ICD-10-CM

## 2024-04-03 DIAGNOSIS — M4804 Spinal stenosis, thoracic region: Secondary | ICD-10-CM | POA: Insufficient documentation

## 2024-04-03 NOTE — Telephone Encounter (Signed)
 Please call patient, MRI lumbar showed multiple level degenerative changes, severe stenosis at T11-12, cord signal abnormalities, moderate at L1-2, L3-4,  MRI cervical also showed multiple degenerative changes, no cord compression.  I have ordered MRI thoracic for better evaluation, move up his follow up after MRI thoracic  Orders Placed This Encounter  Procedures   MR THORACIC SPINE WO CONTRAST        IMPRESSION: This MRI of the lumbar spine without contrast shows the following: Seen on the sagittal images but not covered by axial images, there appears to be severe spinal stenosis at T11-T12 and a focus of T2 hyperintensity within the lower spinal cord just below this level.  If clinically indicated, consider thoracic spine imaging with focus at this level to further evaluate. At L1-L2, degenerative changes cause moderate spinal stenosis and moderately severe left lateral recess stenosis and moderate right lateral recess stenosis.  This could affect the left L2 nerve root. At L2-L3, degenerative changes cause mild spinal stenosis and moderately severe left lateral recess stenosis that could affect the left L3 nerve root At L3-L4, degenerative changes cause moderate spinal stenosis and moderately severe lateral recess stenosis to both sides which could affect the L4 nerve roots At L4-L5, degenerative changes cause mild spinal stenosis and right greater than left lateral recess stenosis which could affect the right L5 nerve root At L5-S1, there is trace retrolisthesis and of the degenerative change but no spinal stenosis or nerve root compressiona  IMPRESSION: This MRI of the cervical spine without contrast shows the following: The spinal cord appears normal. At C3-C4, degenerative changes causing moderate spinal stenosis and moderately severe bilateral foraminal narrowing which could affect the C4 nerve roots. At C4-C5, degenerative changes cause mild spinal stenosis and moderately severe  foraminal narrowing that could affect the C5 nerve roots At C5-C6, degenerative changes cause mild spinal stenosis and moderately severe right foraminal narrowing that could affect the right C6 nerve root At C6-C7, degenerative changes cause mild spinal stenosis but no nerve root compression. At C7-T1, degenerative changes causes borderline spinal stenosis.  No nerve root compression.

## 2024-04-03 NOTE — Telephone Encounter (Signed)
 error

## 2024-04-04 NOTE — Telephone Encounter (Signed)
 Patient informed with the below results.

## 2024-04-05 ENCOUNTER — Ambulatory Visit
Admission: RE | Admit: 2024-04-05 | Discharge: 2024-04-05 | Disposition: A | Discharge status: Home / Self Care | Source: Ambulatory Visit | Attending: Neurology | Admitting: Neurology

## 2024-04-05 DIAGNOSIS — R269 Unspecified abnormalities of gait and mobility: Secondary | ICD-10-CM | POA: Diagnosis not present

## 2024-04-05 DIAGNOSIS — M4804 Spinal stenosis, thoracic region: Secondary | ICD-10-CM | POA: Diagnosis not present

## 2024-04-11 ENCOUNTER — Telehealth: Payer: Self-pay | Admitting: Neurology

## 2024-04-11 DIAGNOSIS — M4804 Spinal stenosis, thoracic region: Secondary | ICD-10-CM

## 2024-04-11 DIAGNOSIS — R269 Unspecified abnormalities of gait and mobility: Secondary | ICD-10-CM

## 2024-04-11 NOTE — Telephone Encounter (Signed)
 I called patient's, explained MRI findings, moderate to severe spinal stenosis T8 11-12,  I have referred him to neurosurgeon  Orders Placed This Encounter  Procedures   Ambulatory referral to Neurosurgery        IMPRESSION: This MRI of the cervical spine without contrast shows the following: The spinal cord appears normal. At C3-C4, degenerative changes causing moderate spinal stenosis and moderately severe bilateral foraminal narrowing which could affect the C4 nerve roots. At C4-C5, degenerative changes cause mild spinal stenosis and moderately severe foraminal narrowing that could affect the C5 nerve roots At C5-C6, degenerative changes cause mild spinal stenosis and moderately severe right foraminal narrowing that could affect the right C6 nerve root At C6-C7, degenerative changes cause mild spinal stenosis but no nerve root compression. At C7-T1, degenerative changes causes borderline spinal stenosis.  No nerve root compression  MRI thoracic spine (without) demonstrating: - At T11-12 disc bulging and facet hypertrophy with moderate-severe spinal stenosis, severe right and moderate to severe left foraminal stenosis; T2 hyperintense cord signal abnormality within the spinal cord likely representing myelomalacia. - At T10-11 disc bulging and facet hypertrophy with severe right foraminal stenosis.   IMPRESSION: This MRI of the lumbar spine without contrast shows the following: Seen on the sagittal images but not covered by axial images, there appears to be severe spinal stenosis at T11-T12 and a focus of T2 hyperintensity within the lower spinal cord just below this level.  If clinically indicated, consider thoracic spine imaging with focus at this level to further evaluate. At L1-L2, degenerative changes cause moderate spinal stenosis and moderately severe left lateral recess stenosis and moderate right lateral recess stenosis.  This could affect the left L2 nerve root. At L2-L3,  degenerative changes cause mild spinal stenosis and moderately severe left lateral recess stenosis that could affect the left L3 nerve root At L3-L4, degenerative changes cause moderate spinal stenosis and moderately severe lateral recess stenosis to both sides which could affect the L4 nerve roots At L4-L5, degenerative changes cause mild spinal stenosis and right greater than left lateral recess stenosis which could affect the right L5 nerve root At L5-S1, there is trace retrolisthesis and of the degenerative change but no spinal stenosis or nerve root compressiona

## 2024-05-04 NOTE — Progress Notes (Unsigned)
 "   Referring Physician:  Onita Duos, MD [address] [place],  [place] [address]  Primary Physician:  Sheryle Carwin, MD  History of Present Illness: 05/05/2024 Mr. Dilon Lank is here today with a chief complaint of cervical and thoracic spondylotic myelopathy who presents for evaluation of progressive gait disturbance and sensory changes.  Over the past year he has had worsening balance and difficulty walking, with day-to-day variability. For about eight months he has used a wide-based gait, and both he and his family notice gait impairment. He has had no trips or falls and continues daily ambulation despite fluctuating severity.  He denies upper extremity weakness, sensory loss, back pain, or bladder dysfunction. He notes slower bowel movements over the last five to six years without incontinence.  He has bilateral hand contractures that are painless and have not required surgery.  He previously underwent anterior cervical discectomy and fusion. Prior workup included nerve conduction studies and spine imaging. He does not smoke and is not taking many medications.   Discussed the use of AI scribe software for clinical note transcription with the patient, who gave verbal consent to proceed.  EMG on 12/10/23  Duration: 1 year  Bowel/Bladder Dysfunction: none  Conservative measures:  Physical therapy:  has not participated in Multimodal medical therapy including regular antiinflammatories:   Injections:  no epidural steroid injections  Past Surgery: no spinal surgeries   FRISCO CORDTS has symptoms of cervical and thoracic myelopathy.   The symptoms are causing a significant impact on the patient's life.   I have utilized the care everywhere function in epic to review the outside records available from external health systems.   Review of Systems:  A 10 point review of systems is negative, except for the pertinent positives and negatives detailed in the HPI.  Past  Medical History: Past Medical History:  Diagnosis Date   Cancer [facility])    prostate cancer (2013)   Constipation    Hypertension     Past Surgical History: Past Surgical History:  Procedure Laterality Date   COLONOSCOPY WITH PROPOFOL  N/A 02/19/2023   Surgeon: Shaaron Lamar HERO, MD; 4 mm adenomatous polyp.  Recommended 7-year surveillance.   POLYPECTOMY  02/19/2023   Procedure: POLYPECTOMY;  Surgeon: Shaaron Lamar HERO, MD;  Location: [facility];  Service: Endoscopy;;   PROSTATECTOMY      Allergies: Allergies as of 05/05/2024   (No Known Allergies)    Medications: Current Medications[1]  Social History: Social History[2]  Family Medical History: Family History  Problem Relation Age of Onset   Colon cancer Neg Hx    Colon polyps Neg Hx    Neuropathy Neg Hx    Neuromuscular disorder Neg Hx     Physical Examination: Vitals:   05/05/24 1000  BP: (!) 154/84    General: Patient is in no apparent distress. Attention to examination is appropriate.  Neck:   Supple.  Full range of motion.  Respiratory: Patient is breathing without any difficulty.   NEUROLOGICAL:     Awake, alert, oriented to person, place, and time.  Speech is clear and fluent.   Cranial Nerves: Pupils equal round and reactive to light.  Facial tone is symmetric.  Facial sensation is symmetric. Shoulder shrug is symmetric. Tongue protrusion is midline.  There is no pronator drift.  Strength: Side Biceps Triceps Deltoid Interossei Grip Wrist Ext. Wrist Flex.  R 5 5 5 5 5 5 5   L 5 5 5 5 5 5  5  Side Iliopsoas Quads Hamstring PF DF EHL  R 4+ 4+ 5 5 5 5   L 4+ 4+ 5 5 5 5    Reflexes are 2+ and symmetric at the biceps, triceps, brachioradialis, 3+ patella and  2+ achilles.   Hoffman's is present.  Suprapatellar reflex is present bilaterally  Bilateral upper and lower extremity sensation is symmetric but diminished in his legs to light touch.    No evidence of dysmetria noted.  Gait is abnormal and  wide-based.  He cannot perform tandem gait..     Medical Decision Making  Imaging: MR C spine 04/03/2024 IMPRESSION: This MRI of the cervical spine without contrast shows the following: The spinal cord appears normal. At C3-C4, degenerative changes causing moderate spinal stenosis and moderately severe bilateral foraminal narrowing which could affect the C4 nerve roots. At C4-C5, degenerative changes cause mild spinal stenosis and moderately severe foraminal narrowing that could affect the C5 nerve roots At C5-C6, degenerative changes cause mild spinal stenosis and moderately severe right foraminal narrowing that could affect the right C6 nerve root At C6-C7, degenerative changes cause mild spinal stenosis but no nerve root compression. At C7-T1, degenerative changes causes borderline spinal stenosis.  No nerve root compression.    MRI L spine 04/03/2024 IMPRESSION: This MRI of the lumbar spine without contrast shows the following: Seen on the sagittal images but not covered by axial images, there appears to be severe spinal stenosis at T11-T12 and a focus of T2 hyperintensity within the lower spinal cord just below this level.  If clinically indicated, consider thoracic spine imaging with focus at this level to further evaluate. At L1-L2, degenerative changes cause moderate spinal stenosis and moderately severe left lateral recess stenosis and moderate right lateral recess stenosis.  This could affect the left L2 nerve root. At L2-L3, degenerative changes cause mild spinal stenosis and moderately severe left lateral recess stenosis that could affect the left L3 nerve root At L3-L4, degenerative changes cause moderate spinal stenosis and moderately severe lateral recess stenosis to both sides which could affect the L4 nerve roots At L4-L5, degenerative changes cause mild spinal stenosis and right greater than left lateral recess stenosis which could affect the right L5 nerve root At L5-S1,  there is trace retrolisthesis and of the degenerative change but no spinal stenosis or nerve root compressiona   MRI T spine 04/05/2024 IMPRESSION:    MRI thoracic spine (without) demonstrating: - At T11-12 disc bulging and facet hypertrophy with moderate-severe spinal stenosis, severe right and moderate to severe left foraminal stenosis; T2 hyperintense cord signal abnormality within the spinal cord likely representing myelomalacia. - At T10-11 disc bulging and facet hypertrophy with severe right foraminal stenosis.  I have personally reviewed the images and agree with the above interpretation.  Assessment and Plan: Mr. Mikita is a pleasant 69 y.o. male with cervical myelopathy due to C3-4 stenosis and thoracic myelopathy due to severe stenosis at T11-12 due to spondylosis.  He has myelomalacia at T11-12.  There is no role for conservative management for this condition.  He has objective signs of thoracic and cervical myelopathy.  Additionally, he has progressive symptoms.  I have recommended that he proceed with surgical intervention.  I have recommended C3-4 anterior cervical discectomy and fusion as well as T11-12 posterior spinal fusion with decompression.  Fusion is required at T11-12 as he will require bilateral facetectomy for complete decompression of his spinal cord.  I discussed the planned procedure at length with the patient, including the risks,  benefits, alternatives, and indications. The risks discussed include but are not limited to bleeding, infection, need for reoperation, spinal fluid leak, stroke, vision loss, anesthetic complication, coma, paralysis, and even death. We also discussed the possibility of post-operative dysphagia, vocal cord paralysis, and the risk of adjacent segment disease in the future. I also described in detail that improvement was not guaranteed.  The patient expressed understanding of these risks, and asked that we proceed with surgery. I described the  surgery in layman's terms, and gave ample opportunity for questions, which were answered to the best of my ability.   I spent a total of 40 minutes in this patient's care today. This time was spent reviewing pertinent records including imaging studies, obtaining and confirming history, performing a directed evaluation, formulating and discussing my recommendations, and documenting the visit within the medical record.     Thank you for involving me in the care of this patient.      Bill Yohn K. Clois MD, [facility] Neurosurgery     [1]  Current Outpatient Medications:    Multiple Vitamin (MULTI-VITAMIN) tablet, Take by mouth., Disp: , Rfl:    olmesartan (BENICAR) 20 MG tablet, Take 20 mg by mouth daily., Disp: , Rfl:    rosuvastatin (CRESTOR) 10 MG tablet, SMARTSIG:1 Tablet(s) By Mouth Every Evening, Disp: , Rfl:  [2]  Social History Tobacco Use   Smoking status: Never   Smokeless tobacco: Never  Vaping Use   Vaping status: Never Used  Substance Use Topics   Alcohol use: Never   Drug use: Never   "

## 2024-05-05 ENCOUNTER — Other Ambulatory Visit: Payer: Self-pay

## 2024-05-05 ENCOUNTER — Encounter: Payer: Self-pay | Admitting: Neurosurgery

## 2024-05-05 ENCOUNTER — Ambulatory Visit: Admitting: Neurosurgery

## 2024-05-05 VITALS — BP 154/84 | Ht 69.0 in | Wt 178.0 lb

## 2024-05-05 DIAGNOSIS — M4802 Spinal stenosis, cervical region: Secondary | ICD-10-CM

## 2024-05-05 DIAGNOSIS — M4714 Other spondylosis with myelopathy, thoracic region: Secondary | ICD-10-CM

## 2024-05-05 DIAGNOSIS — G959 Disease of spinal cord, unspecified: Secondary | ICD-10-CM

## 2024-05-05 DIAGNOSIS — M4804 Spinal stenosis, thoracic region: Secondary | ICD-10-CM

## 2024-05-05 DIAGNOSIS — G9589 Other specified diseases of spinal cord: Secondary | ICD-10-CM

## 2024-05-05 DIAGNOSIS — Z01818 Encounter for other preprocedural examination: Secondary | ICD-10-CM

## 2024-05-05 NOTE — Patient Instructions (Signed)
 Please see below for information in regards to your upcoming surgery:   Planned surgery: C3-4 Anterior Cervical Discectomy and Fusion; T11-12 Posterior Spinal Fusion and Decompression   Surgery date: 06/22/24 at [facility] (Medical Mall: [address], [place], [place] [address]) - you will find out your arrival time the business day before your surgery.   Pre-op appointment at [facility] Pre-admit Testing: you will receive a call with a date/time for this appointment. If you are scheduled for an in person appointment, Pre-admit Testing is located on the first floor of the [facility] building, [address] [facility], [address]. During this appointment, they will advise you which medications you can take the morning of surgery, and which medications you will need to hold for surgery. Labs (such as blood work, EKG) may be done at your pre-op appointment. You are not required to fast for these labs. Should you need to change your pre-op appointment, please call Pre-admit testing at [phone].   Please bring your medication bottles or an up to date medication list to your pre-admit testing appointment (regardless of whether we have a list in your chart).     Surgical clearance: we will send a clearance form to Gaither Langton, MD. They may wish to see you in their office prior to signing the clearance form. If so, they may call you to schedule an appointment.    NSAIDS (Non-steroidal anti-inflammatory drugs): because you are having a fusion, please avoid taking any NSAIDS (examples: ibuprofen, motrin, aleve, naproxen, meloxicam, diclofenac) for 3 months after surgery. Celebrex is an exception and is OK to take, if prescribed. Tylenol is not an NSAID.    Common restrictions after spine surgery: No bending, lifting, or twisting (BLT). Avoid lifting objects heavier than 10 pounds for the first 6 weeks after surgery. Where possible, avoid household activities that  involve lifting, bending, reaching, pushing, or pulling such as laundry, vacuuming, grocery shopping, and childcare. Try to arrange for help from friends and family for these activities while you heal. Do not drive while taking prescription pain medication. Weeks 6 through 12 after surgery: avoid lifting more than 25 pounds.    X-rays after surgery: Because you are having a fusion or arthroplasty: for appointments after your 2 week follow-up: please arrive our office 30 minutes prior to your appointment for x-rays. This applies to every appointment after your 2 week follow-up. Failure to do so may result in your appointment being rescheduled.    How to contact us :  If you have any questions/concerns before or after surgery, you can reach us  at [phone], or you can send a mychart message. We can be reached by phone or mychart 8am-4pm, Monday-Friday.  *Please note: Calls after 4pm are forwarded to a third party answering service. Mychart messages are not routinely monitored during evenings, weekends, and holidays. Please call our office to contact the answering service for urgent concerns during non-business hours.     If you have FMLA/disability paperwork, please drop it off or fax it to [phone]   Appointments/FMLA & disability paperwork: Reche Hait, & Nichole Registered Nurse/Surgery scheduler: Kendelyn, RN & Katie, RN Certified Medical Assistants: Don, CMA, Elenor, CMA, Damien, CMA, & Auston, [place] Physician Assistants: Lyle Decamp, PA-C, Edsel Goods, PA-C & Glade Boys, PA-C Surgeons: Penne Sharps, MD & Reeves Daisy, MD    [facility] PREADMIT TESTING VISIT and SURGERY INFORMATION SHEET   Now that surgery has been scheduled you can anticipate several phone  calls from [facility]. A pharmacy technician will call you to verify your current list of medications taken at home.               The Pre-Service Center will call to verify  your insurance information and to give you billing estimates and information.             The Preadmit Testing Office will be calling to schedule a visit to obtain information for the anesthesia team and provide instructions on preparation for surgery.  What can you expect for the Preadmit Testing Visit: Appointments may be scheduled in-person or by telephone.  If a telephone visit is scheduled, you may be asked to come into the office to have lab tests or other studies performed.   This visit will not be completed any greater than 14 days prior to your surgery.  If your surgery has been scheduled for a future date, please do not be alarmed if we have not contacted you to schedule an appointment more than a month prior to the surgery date.    Please be prepared to provide the following information during this appointment:            -Personal medical history                                               -Medication and allergy list            -Any history of problems with anesthesia              -Recent lab work or diagnostic studies            -Please notify us  of any needs we should be aware of to provide the best care possible           -You will be provided with instructions on how to prepare for your surgery.    On The Day of Surgery:  You must have a driver to take you home after surgery, you will be asked not to drive for 24 hours following surgery.  Taxi, Gisele and non-medical transport will not be acceptable means of transportation unless you have a responsible individual who will be traveling with you.  Visitors in the surgical area:   2 people will be able to visit you in your room once your preparation for surgery has been completed. During surgery, your visitors will be asked to wait in the Surgery Waiting Area.  It is not a requirement for them to stay, if they prefer to leave and come back.  Your visitor(s) will be given an update once the surgery has been completed.  No visitors  are allowed in the initial recovery room to respect patient privacy and safety.  Once you are more awake and transfer to the secondary recovery area, or are transferred to an inpatient room, visitors will again be able to see you.  To respect and protect your privacy: We will ask on the day of surgery who your driver will be and what the contact number for that individual will be. We will ask if it is okay to share information with this individual, or if there is an alternative individual that we, or the surgeon, should contact to provide updates and information. If family or friends come to the surgical information desk requesting information about  you, who you have not listed with us , no information will be given.   It may be helpful to designate someone as the main contact who will be responsible for updating your other friends and family.    PREADMIT TESTING OFFICE: [phone] SAME DAY SURGERY: [phone] We look forward to caring for you before and throughout the process of your surgery.

## 2024-06-14 ENCOUNTER — Ambulatory Visit: Admitting: Gastroenterology

## 2024-06-15 ENCOUNTER — Ambulatory Visit: Admitting: Neurology

## 2024-06-21 ENCOUNTER — Ambulatory Visit: Admitting: Neurology

## 2024-06-22 ENCOUNTER — Ambulatory Visit: Admit: 2024-06-22 | Admitting: Neurosurgery

## 2024-06-22 SURGERY — ANTERIOR CERVICAL DECOMPRESSION/DISCECTOMY FUSION 1 LEVEL
Anesthesia: General

## 2024-07-06 ENCOUNTER — Encounter: Admitting: Orthopedic Surgery

## 2024-08-02 ENCOUNTER — Encounter: Admitting: Neurosurgery

## 2024-08-02 ENCOUNTER — Other Ambulatory Visit

## 2024-09-12 ENCOUNTER — Encounter: Admitting: Orthopedic Surgery

## 2024-09-12 ENCOUNTER — Other Ambulatory Visit

## 2024-10-03 ENCOUNTER — Ambulatory Visit: Admitting: Adult Health
